# Patient Record
Sex: Female | Born: 1995 | Hispanic: Yes | Marital: Single | Smoking: Former smoker
Health system: Southern US, Community
[De-identification: ages and names within clinical notes are randomized; demographics above are authoritative.]

## PROBLEM LIST (undated history)

## (undated) DIAGNOSIS — Z789 Other specified health status: Secondary | ICD-10-CM

## (undated) HISTORY — PX: NO PAST SURGERIES: SHX2092

---

## 2014-12-07 NOTE — L&D Delivery Note (Signed)
Patient is 19 y.o. G1P0 7546w0d admitted for IOL secondary to pre-eclampsia, hx of morbid obesity and increased risk of Down Syndrome in this pregnancy. Patient was induced with cytotec x2 and foley bulb, followed by pitocin. Patient labored on pitocin until complete and ready to push.    Delivery Note At 4:36 AM a viable and healthy female was delivered via Vaginal, Spontaneous Delivery (Presentation: Direct OA).  APGAR: 9, 9; weight pending.   Placenta status: spontaneous and intact.  Cord: 3 vessels. Cord pH: N/A  Anesthesia: Epidural  Episiotomy: None  Lacerations:  None Suture Repair: N/A Est. Blood Loss (mL):  300 cc  Mom to postpartum.  Baby to Couplet care / Skin to Skin.  [name] 11/27/2015, 4:52 AM   OB fellow attestation: Patient is a G1P0 at 3346w0d who was admitted for IOL for preeclampsia without severe featurs, significant hx of morbid obesity, icnreased Down's risk . She progressed with cytotec, FB and pitocin.  I was gloved and present for delivery in its entirety.  Second stage of labor progressed well. Shallow variable decels during second stage noted.  Complications: none  Lacerations: None  EBL: 300cc  Federico FlakeKimberly Niles Danetra Glock, MD 5:00 AM

## 2015-02-24 ENCOUNTER — Encounter (HOSPITAL_COMMUNITY): Payer: Self-pay | Admitting: Emergency Medicine

## 2015-02-24 ENCOUNTER — Emergency Department (HOSPITAL_COMMUNITY)
Admission: EM | Admit: 2015-02-24 | Discharge: 2015-02-24 | Disposition: A | Discharge status: Home / Self Care | Payer: Self-pay | Attending: Emergency Medicine | Admitting: Emergency Medicine

## 2015-02-24 DIAGNOSIS — Z87891 Personal history of nicotine dependence: Secondary | ICD-10-CM | POA: Insufficient documentation

## 2015-02-24 DIAGNOSIS — R1030 Lower abdominal pain, unspecified: Secondary | ICD-10-CM | POA: Insufficient documentation

## 2015-02-24 DIAGNOSIS — Z79899 Other long term (current) drug therapy: Secondary | ICD-10-CM | POA: Insufficient documentation

## 2015-02-24 DIAGNOSIS — R112 Nausea with vomiting, unspecified: Secondary | ICD-10-CM | POA: Insufficient documentation

## 2015-02-24 DIAGNOSIS — Z3202 Encounter for pregnancy test, result negative: Secondary | ICD-10-CM | POA: Insufficient documentation

## 2015-02-24 DIAGNOSIS — R000 Tachycardia, unspecified: Secondary | ICD-10-CM | POA: Insufficient documentation

## 2015-02-24 DIAGNOSIS — Z791 Long term (current) use of non-steroidal anti-inflammatories (NSAID): Secondary | ICD-10-CM | POA: Insufficient documentation

## 2015-02-24 LAB — URINALYSIS, ROUTINE W REFLEX MICROSCOPIC
Bilirubin Urine: NEGATIVE
GLUCOSE, UA: NEGATIVE mg/dL
Hgb urine dipstick: NEGATIVE
KETONES UR: NEGATIVE mg/dL
LEUKOCYTES UA: NEGATIVE
NITRITE: NEGATIVE
PH: 5.5 (ref 5.0–8.0)
PROTEIN: NEGATIVE mg/dL
SPECIFIC GRAVITY, URINE: 1.024 (ref 1.005–1.030)
UROBILINOGEN UA: 1 mg/dL (ref 0.0–1.0)

## 2015-02-24 LAB — HEPATIC FUNCTION PANEL
ALT: 24 U/L (ref 0–35)
AST: 28 U/L (ref 0–37)
Albumin: 3.7 g/dL (ref 3.5–5.2)
Alkaline Phosphatase: 66 U/L (ref 39–117)
BILIRUBIN TOTAL: 0.6 mg/dL (ref 0.3–1.2)
Bilirubin, Direct: 0.2 mg/dL (ref 0.0–0.5)
Indirect Bilirubin: 0.4 mg/dL (ref 0.3–0.9)
TOTAL PROTEIN: 7 g/dL (ref 6.0–8.3)

## 2015-02-24 LAB — CBC WITH DIFFERENTIAL/PLATELET
BASOS ABS: 0 10*3/uL (ref 0.0–0.1)
Basophils Relative: 0 % (ref 0–1)
Eosinophils Absolute: 0.1 10*3/uL (ref 0.0–0.7)
Eosinophils Relative: 1 % (ref 0–5)
HEMATOCRIT: 39.4 % (ref 36.0–46.0)
HEMOGLOBIN: 13.2 g/dL (ref 12.0–15.0)
LYMPHS ABS: 2.2 10*3/uL (ref 0.7–4.0)
Lymphocytes Relative: 29 % (ref 12–46)
MCH: 26.8 pg (ref 26.0–34.0)
MCHC: 33.5 g/dL (ref 30.0–36.0)
MCV: 80.1 fL (ref 78.0–100.0)
MONO ABS: 0.5 10*3/uL (ref 0.1–1.0)
MONOS PCT: 7 % (ref 3–12)
NEUTROS ABS: 4.7 10*3/uL (ref 1.7–7.7)
NEUTROS PCT: 63 % (ref 43–77)
Platelets: 312 10*3/uL (ref 150–400)
RBC: 4.92 MIL/uL (ref 3.87–5.11)
RDW: 14 % (ref 11.5–15.5)
WBC: 7.5 10*3/uL (ref 4.0–10.5)

## 2015-02-24 LAB — I-STAT CHEM 8, ED
BUN: 14 mg/dL (ref 6–23)
CHLORIDE: 106 mmol/L (ref 96–112)
Calcium, Ion: 1.13 mmol/L (ref 1.12–1.23)
Creatinine, Ser: 0.6 mg/dL (ref 0.50–1.10)
GLUCOSE: 101 mg/dL — AB (ref 70–99)
HEMATOCRIT: 40 % (ref 36.0–46.0)
Hemoglobin: 13.6 g/dL (ref 12.0–15.0)
POTASSIUM: 4 mmol/L (ref 3.5–5.1)
Sodium: 138 mmol/L (ref 135–145)
TCO2: 19 mmol/L (ref 0–100)

## 2015-02-24 LAB — WET PREP, GENITAL
Clue Cells Wet Prep HPF POC: NONE SEEN
Trich, Wet Prep: NONE SEEN
WBC WET PREP: NONE SEEN
Yeast Wet Prep HPF POC: NONE SEEN

## 2015-02-24 LAB — POC URINE PREG, ED: PREG TEST UR: NEGATIVE

## 2015-02-24 LAB — LIPASE, BLOOD: Lipase: 23 U/L (ref 11–59)

## 2015-02-24 MED ORDER — ONDANSETRON HCL 4 MG/2ML IJ SOLN
4.0000 mg | Freq: Once | INTRAMUSCULAR | Status: AC
  Administered 2015-02-24: 4 mg via INTRAVENOUS
  Filled 2015-02-24: qty 2

## 2015-02-24 MED ORDER — MORPHINE SULFATE 4 MG/ML IJ SOLN: 4.0000 mg | Freq: Once | INTRAMUSCULAR | Status: DC

## 2015-02-24 MED ORDER — ONDANSETRON 4 MG PO TBDP: 4.0000 mg | ORAL_TABLET | Freq: Three times a day (TID) | ORAL | Status: DC | PRN

## 2015-02-24 MED ORDER — SODIUM CHLORIDE 0.9 % IV BOLUS (SEPSIS)
1000.0000 mL | Freq: Once | INTRAVENOUS | Status: AC
  Administered 2015-02-24: 1000 mL via INTRAVENOUS

## 2015-02-24 MED ORDER — NAPROXEN 500 MG PO TABS: 500.0000 mg | ORAL_TABLET | Freq: Two times a day (BID) | ORAL | Status: DC

## 2015-02-24 MED ORDER — KETOROLAC TROMETHAMINE 15 MG/ML IJ SOLN
15.0000 mg | Freq: Once | INTRAMUSCULAR | Status: AC
  Administered 2015-02-24: 15 mg via INTRAVENOUS
  Filled 2015-02-24: qty 1

## 2015-02-24 NOTE — ED Provider Notes (Signed)
CSN: [id number]     Arrival date & time 02/24/15  1608 History   First MD Initiated Contact with Patient 02/24/15 1810     Chief Complaint  Patient presents with  . Abdominal Pain     (Consider location/radiation/quality/duration/timing/severity/associated sxs/prior Treatment) Patient is a 19 y.o. female presenting with abdominal pain. The history is provided by the patient.  Abdominal Pain Pain location:  Suprapubic, LLQ and RLQ Pain quality: aching   Pain radiates to:  Back Pain severity:  Moderate Onset quality:  Gradual Duration:  2 weeks Timing:  Constant Progression:  Worsening Chronicity:  New Relieved by:  Nothing Worsened by:  Nothing tried Ineffective treatments:  None tried Associated symptoms: nausea and vomiting   Associated symptoms: no anorexia, no cough, no diarrhea, no dysuria, no fever, no hematuria and no shortness of breath   Risk factors: obesity     History reviewed. No pertinent past medical history. History reviewed. No pertinent past surgical history. No family history on file. History  Substance Use Topics  . Smoking status: Former Games developermoker  . Smokeless tobacco: Not on file  . Alcohol Use: No   OB History    No data available     Review of Systems  Constitutional: Negative for fever.  Respiratory: Negative for cough and shortness of breath.   Gastrointestinal: Positive for nausea, vomiting and abdominal pain. Negative for diarrhea and anorexia.  Genitourinary: Negative for dysuria and hematuria.  All other systems reviewed and are negative.     Allergies  Review of patient's allergies indicates no known allergies.  Home Medications   Prior to Admission medications   Medication Sig Start Date End Date Taking? Authorizing Provider  methylphenidate 36 MG PO CR tablet Take 72 mg by mouth daily.   Yes Historical Provider, MD  TRAZODONE HCL PO Take 1 tablet by mouth at bedtime.   Yes Historical Provider, MD  naproxen (NAPROSYN) 500 MG  tablet Take 1 tablet (500 mg total) by mouth 2 (two) times daily. 02/24/15   Lisa LeitzAlex Santrice Muzio, MD  ondansetron (ZOFRAN ODT) 4 MG disintegrating tablet Take 1 tablet (4 mg total) by mouth every 8 (eight) hours as needed for nausea. 02/24/15   Lisa LeitzAlex Jeovanni Heuring, MD   BP 122/69 mmHg  Pulse 79  Temp(Src) 98.3 F (36.8 C) (Oral)  Resp 18  Ht 5\' 4"  (1.626 m)  Wt 230 lb (104.327 kg)  BMI 39.46 kg/m2  SpO2 100%  LMP 01/09/2015 Physical Exam  Constitutional: She is oriented to person, place, and time. She appears well-developed and well-nourished. No distress.  HENT:  Head: Normocephalic and atraumatic.  Mouth/Throat: No oropharyngeal exudate.  Eyes: Conjunctivae are normal. Pupils are equal, round, and reactive to light.  Neck: Normal range of motion. Neck supple.  Cardiovascular: Regular rhythm, normal heart sounds and intact distal pulses.  Tachycardia present.  Exam reveals no gallop and no friction rub.   No murmur heard. Pulmonary/Chest: Effort normal and breath sounds normal. No respiratory distress. She has no wheezes. She has no rales.  Abdominal: Soft. Bowel sounds are normal. She exhibits no distension and no mass. There is tenderness (suprapubic). There is no rebound and no guarding.  Morbid obesity  Genitourinary: Vagina normal and uterus normal. Pelvic exam was performed with patient supine. There is no rash, tenderness, lesion or injury on the right labia. There is no rash, tenderness, lesion or injury on the left labia. Cervix exhibits no motion tenderness and no discharge. Right adnexum displays no mass, no tenderness  and no fullness. Left adnexum displays no mass, no tenderness and no fullness.  Musculoskeletal: Normal range of motion. She exhibits no edema.  Lymphadenopathy:    She has no cervical adenopathy.  Neurological: She is alert and oriented to person, place, and time.  Skin: Skin is warm and dry. No rash noted. She is not diaphoretic.  Psychiatric: She has a normal mood and  affect. Her behavior is normal. Judgment and thought content normal.  Nursing note and vitals reviewed.   ED Course  Procedures (including critical care time) Labs Review Labs Reviewed  I-STAT CHEM 8, ED - Abnormal; Notable for the following:    Glucose, Bld 101 (*)    All other components within normal limits  WET PREP, GENITAL  URINALYSIS, ROUTINE W REFLEX MICROSCOPIC  CBC WITH DIFFERENTIAL/PLATELET  HEPATIC FUNCTION PANEL  LIPASE, BLOOD  POC URINE PREG, ED  GC/CHLAMYDIA PROBE AMP ([place])    Imaging Review No results found.   EKG Interpretation None      MDM   Final diagnoses:  Lower abdominal pain  Non-intractable vomiting with nausea, vomiting of unspecified type    19 year old morbidly obese female presents with lower abdominal pain, nausea, vomiting of 2 weeks' duration. Denies any vaginal or urinary symptoms. Denies diarrhea. Emesis is nonbloody and nonbilious. On exam she is tender diffusely but primarily suprapubically and right upper quadrant. Negative Murphy's. Afebrile and clinically stable. Abdominal exam is benign and I have low concern for acute abdominal process. We'll defer imaging at this time. Urinalysis and clinic or pregnancy will need to be obtained as well as hepatic function panel. Basic laboratory workup were performed and reassuring. Fluids, Zofran, Toradol given for symptomatic control. Pelvic exam will be performed to evaluate for STI.  9:15 PM pelvic exam reassuring. Abdominal pain significantly improved with anti-inflammatory. Vital signs normalized with fluids. Will treat with anti-inflammatories and antibiotics for likely gastroenteritis. PCP follow-up recommended in the next 2 days. Repeat abdominal exam benign and I have low suspicion for acute abdominal process and no further imaging or workup needed. Will follow up on gonorrhea and chlamydia swabs for for treatment but clinically does not have PID.  Lisa Leitz, MD 02/24/15  2117  Lisa Canal, MD 02/24/15 [phone]

## 2015-02-24 NOTE — ED Notes (Signed)
Pt is aware urine is needed for testing, pt unable to urinate at this time.  

## 2015-02-24 NOTE — Discharge Instructions (Signed)

## 2015-02-24 NOTE — ED Notes (Signed)
Pt c/o lower abd pain x's 2 weeks with nausea and vomiting.  Also c/o lower back pain.  Denies vag. Discharge.

## 2015-02-24 NOTE — ED Notes (Signed)
Pt stable, ambulatory, family at bedside

## 2015-02-25 LAB — GC/CHLAMYDIA PROBE AMP (~~LOC~~) NOT AT ARMC
CHLAMYDIA, DNA PROBE: POSITIVE — AB
NEISSERIA GONORRHEA: NEGATIVE

## 2015-02-27 ENCOUNTER — Telehealth (HOSPITAL_COMMUNITY): Payer: Self-pay

## 2015-02-27 NOTE — ED Notes (Signed)
Positive for chlamydia. Chart sent to edp office for review 

## 2015-02-28 ENCOUNTER — Telehealth (HOSPITAL_BASED_OUTPATIENT_CLINIC_OR_DEPARTMENT_OTHER): Payer: Self-pay | Admitting: *Deleted

## 2015-06-04 ENCOUNTER — Emergency Department (HOSPITAL_COMMUNITY)
Admission: EM | Admit: 2015-06-04 | Discharge: 2015-06-04 | Disposition: A | Discharge status: Home / Self Care | Payer: Medicaid Other | Attending: Emergency Medicine | Admitting: Emergency Medicine

## 2015-06-04 ENCOUNTER — Emergency Department (HOSPITAL_COMMUNITY): Payer: Medicaid Other

## 2015-06-04 ENCOUNTER — Encounter (HOSPITAL_COMMUNITY): Payer: Self-pay | Admitting: *Deleted

## 2015-06-04 DIAGNOSIS — R109 Unspecified abdominal pain: Secondary | ICD-10-CM

## 2015-06-04 DIAGNOSIS — Z3490 Encounter for supervision of normal pregnancy, unspecified, unspecified trimester: Secondary | ICD-10-CM

## 2015-06-04 DIAGNOSIS — R52 Pain, unspecified: Secondary | ICD-10-CM

## 2015-06-04 DIAGNOSIS — Z87891 Personal history of nicotine dependence: Secondary | ICD-10-CM | POA: Diagnosis not present

## 2015-06-04 DIAGNOSIS — Z79899 Other long term (current) drug therapy: Secondary | ICD-10-CM | POA: Diagnosis not present

## 2015-06-04 DIAGNOSIS — Z3A17 17 weeks gestation of pregnancy: Secondary | ICD-10-CM | POA: Diagnosis not present

## 2015-06-04 DIAGNOSIS — Z791 Long term (current) use of non-steroidal anti-inflammatories (NSAID): Secondary | ICD-10-CM | POA: Insufficient documentation

## 2015-06-04 DIAGNOSIS — K8020 Calculus of gallbladder without cholecystitis without obstruction: Secondary | ICD-10-CM | POA: Diagnosis not present

## 2015-06-04 DIAGNOSIS — O99612 Diseases of the digestive system complicating pregnancy, second trimester: Secondary | ICD-10-CM | POA: Insufficient documentation

## 2015-06-04 DIAGNOSIS — O9989 Other specified diseases and conditions complicating pregnancy, childbirth and the puerperium: Secondary | ICD-10-CM | POA: Diagnosis present

## 2015-06-04 DIAGNOSIS — O26899 Other specified pregnancy related conditions, unspecified trimester: Secondary | ICD-10-CM

## 2015-06-04 LAB — COMPREHENSIVE METABOLIC PANEL
ALK PHOS: 42 U/L (ref 38–126)
ALT: 29 U/L (ref 14–54)
AST: 32 U/L (ref 15–41)
Albumin: 3.4 g/dL — ABNORMAL LOW (ref 3.5–5.0)
Anion gap: 5 (ref 5–15)
BUN: <5 mg/dL — ABNORMAL LOW (ref 6–20)
CO2: 22 mmol/L (ref 22–32)
Calcium: 8.9 mg/dL (ref 8.9–10.3)
Chloride: 108 mmol/L (ref 101–111)
Creatinine, Ser: 0.38 mg/dL — ABNORMAL LOW (ref 0.44–1.00)
GLUCOSE: 94 mg/dL (ref 65–99)
POTASSIUM: 4 mmol/L (ref 3.5–5.1)
SODIUM: 135 mmol/L (ref 135–145)
Total Bilirubin: 0.8 mg/dL (ref 0.3–1.2)
Total Protein: 6.2 g/dL — ABNORMAL LOW (ref 6.5–8.1)

## 2015-06-04 LAB — CBC WITH DIFFERENTIAL/PLATELET
Basophils Absolute: 0 10*3/uL (ref 0.0–0.1)
Basophils Relative: 0 % (ref 0–1)
EOS PCT: 1 % (ref 0–5)
Eosinophils Absolute: 0.1 10*3/uL (ref 0.0–0.7)
HCT: 35.9 % — ABNORMAL LOW (ref 36.0–46.0)
Hemoglobin: 12.5 g/dL (ref 12.0–15.0)
Lymphocytes Relative: 30 % (ref 12–46)
Lymphs Abs: 2 10*3/uL (ref 0.7–4.0)
MCH: 27.5 pg (ref 26.0–34.0)
MCHC: 34.8 g/dL (ref 30.0–36.0)
MCV: 78.9 fL (ref 78.0–100.0)
Monocytes Absolute: 0.5 10*3/uL (ref 0.1–1.0)
Monocytes Relative: 8 % (ref 3–12)
NEUTROS PCT: 61 % (ref 43–77)
Neutro Abs: 4.2 10*3/uL (ref 1.7–7.7)
PLATELETS: 252 10*3/uL (ref 150–400)
RBC: 4.55 MIL/uL (ref 3.87–5.11)
RDW: 14.3 % (ref 11.5–15.5)
WBC: 6.8 10*3/uL (ref 4.0–10.5)

## 2015-06-04 LAB — URINE MICROSCOPIC-ADD ON

## 2015-06-04 LAB — ABO/RH: ABO/RH(D): O POS

## 2015-06-04 LAB — WET PREP, GENITAL
Trich, Wet Prep: NONE SEEN
YEAST WET PREP: NONE SEEN

## 2015-06-04 LAB — URINALYSIS, ROUTINE W REFLEX MICROSCOPIC
BILIRUBIN URINE: NEGATIVE
Glucose, UA: NEGATIVE mg/dL
KETONES UR: 15 mg/dL — AB
Leukocytes, UA: NEGATIVE
NITRITE: NEGATIVE
PROTEIN: NEGATIVE mg/dL
Specific Gravity, Urine: 1.023 (ref 1.005–1.030)
UROBILINOGEN UA: 1 mg/dL (ref 0.0–1.0)
pH: 7 (ref 5.0–8.0)

## 2015-06-04 LAB — I-STAT BETA HCG BLOOD, ED (MC, WL, AP ONLY): I-stat hCG, quantitative: >2000 m[IU]/mL — ABNORMAL HIGH (ref <5)

## 2015-06-04 LAB — LIPASE, BLOOD: Lipase: 16 U/L — ABNORMAL LOW (ref 22–51)

## 2015-06-04 MED ORDER — ACETAMINOPHEN 325 MG PO TABS
650.0000 mg | ORAL_TABLET | Freq: Once | ORAL | Status: AC
  Administered 2015-06-04: 650 mg via ORAL
  Filled 2015-06-04: qty 2

## 2015-06-04 NOTE — ED Notes (Signed)
Patient transported to Ultrasound 

## 2015-06-04 NOTE — ED Notes (Addendum)
Pt has first OB appt 06-07-15.  Reports LMP 01-31-15

## 2015-06-04 NOTE — ED Notes (Signed)
Pt in c/o lower abd pain and upper abd pain for the last few weeks, pt is approx [redacted] weeks pregnant, denies vaginal bleeding or discharge

## 2015-06-04 NOTE — ED Notes (Signed)
Pelvic cart at bedside, patient in gown

## 2015-06-04 NOTE — ED Provider Notes (Signed)
CSN: [id number]     Arrival date & time 06/04/15  1237 History  This chart was scribed for non-physician practitioner, Trixie Dredge, PA-C, working with Elwin Mocha, MD by Marica Otter, ED Scribe. This patient was seen in room TR02C/TR02C and the patient's care was started at 2:55 PM.   Chief Complaint  Patient presents with  . Abdominal Pain   The history is provided by the patient. No language interpreter was used.   PCP: No primary care provider on file. HPI Comments: Lisa Erickson is a 19 y.o. female, who is [redacted] weeks pregnant with first OBGYN appointment on 06/07/15, who presents to the Emergency Department complaining of atraumatic, daily, intermittent, 7/10, stabbing epigastric, RUQ and suprapubic abd pain with associated intermittent back pain onset one month ago. Pt reports each episode of abd pain lasts approximately an hour; pt notes multiple episodes of abd pain daily. Pt notes n/v at baseline since the start of her pregnancy and denies  worsening any worsening Sx since the onset of her abd pain. Pt further denies dysuria, fever, chills, vaginal discharge, hematuria, blood in vomit, numbness, tingling, vaginal bleeding, vaginal pain, fecal or urinary incontinence, change in appetite. Pt reports taking daily prenatal vitamins.    History reviewed. No pertinent past medical history. History reviewed. No pertinent past surgical history. History reviewed. No pertinent family history. History  Substance Use Topics  . Smoking status: Former Games developer  . Smokeless tobacco: Not on file  . Alcohol Use: No   OB History    Gravida Para Term Preterm AB TAB SAB Ectopic Multiple Living   1              Review of Systems  Genitourinary: Negative for vaginal pain.  Neurological: Negative for numbness.  All other systems reviewed and are negative.  Allergies  Review of patient's allergies indicates no known allergies.  Home Medications   Prior to Admission medications   Medication Sig  Start Date End Date Taking? Authorizing Provider  methylphenidate 36 MG PO CR tablet Take 72 mg by mouth daily.    Historical Provider, MD  naproxen (NAPROSYN) 500 MG tablet Take 1 tablet (500 mg total) by mouth 2 (two) times daily. 02/24/15   [name], MD  ondansetron (ZOFRAN ODT) 4 MG disintegrating tablet Take 1 tablet (4 mg total) by mouth every 8 (eight) hours as needed for nausea. 02/24/15   [name], MD  TRAZODONE HCL PO Take 1 tablet by mouth at bedtime.    Historical Provider, MD   Triage Vitals: BP 128/76 mmHg  Pulse 104  Temp(Src) 98.9 F (37.2 C) (Oral)  Resp 20  Wt 260 lb (117.935 kg)  SpO2 100%  LMP 01/31/2015 Physical Exam  Constitutional: She appears well-developed and well-nourished. No distress.  HENT:  Head: Normocephalic and atraumatic.  Neck: Neck supple.  Pulmonary/Chest: Effort normal.  Abdominal: Soft. She exhibits no mass. There is tenderness (mild diffuse tenderness ). There is no rebound and no guarding.  Genitourinary:  Exam performed by Gabriela Eves, PA-S under my direct supervision,  exam chaperoned, exam extremely limited secondary to patient habitus.  Date: 06/04/2015 Pelvic exam: normal external genitalia without evidence of trauma. VULVA: normal appearing vulva with no masses, tenderness or lesion. VAGINA: normal appearing vagina with normal color and discharge, no lesions. CERVIX: normal appearing cervix without lesions, cervical motion tenderness absent, cervical os closed with out purulent discharge; vaginal discharge small amt white discharge, Wet prep and DNA probe for chlamydia and GC  obtained.   UTERUS: uterus is normal size, shape, consistency and tender.    Neurological: She is alert.  Skin: She is not diaphoretic.  Nursing note and vitals reviewed.   ED Course  Procedures (including critical care time) DIAGNOSTIC STUDIES: Oxygen Saturation is 100% on RA, nl by my interpretation.    COORDINATION OF CARE: 2:58  PM-Discussed treatment plan which includes pelvic exam and ultrasound with pt at bedside and pt agreed to plan.   Labs Review Labs Reviewed  WET PREP, GENITAL - Abnormal; Notable for the following:    Clue Cells Wet Prep HPF POC MODERATE (*)    WBC, Wet Prep HPF POC FEW (*)    All other components within normal limits  CBC WITH DIFFERENTIAL/PLATELET - Abnormal; Notable for the following:    HCT 35.9 (*)    All other components within normal limits  COMPREHENSIVE METABOLIC PANEL - Abnormal; Notable for the following:    BUN <5 (*)    Creatinine, Ser 0.38 (*)    Total Protein 6.2 (*)    Albumin 3.4 (*)    All other components within normal limits  LIPASE, BLOOD - Abnormal; Notable for the following:    Lipase 16 (*)    All other components within normal limits  URINALYSIS, ROUTINE W REFLEX MICROSCOPIC (NOT AT [facility]) - Abnormal; Notable for the following:    Color, Urine AMBER (*)    APPearance CLOUDY (*)    Hgb urine dipstick TRACE (*)    Ketones, ur 15 (*)    All other components within normal limits  URINE MICROSCOPIC-ADD ON - Abnormal; Notable for the following:    Squamous Epithelial / LPF FEW (*)    Bacteria, UA FEW (*)    All other components within normal limits  I-STAT BETA HCG BLOOD, ED ([facility], [facility], [facility] ONLY) - Abnormal; Notable for the following:    I-stat hCG, quantitative >2000.0 (*)    All other components within normal limits  HIV ANTIBODY (ROUTINE TESTING)  ABO/RH  GC/CHLAMYDIA PROBE AMP ([place]) NOT AT [facility]    Imaging Review US Ob Comp Less 14 Wks  06/04/2015   CLINICAL DATA:  19 year old pregnant female with lower abdominal and pelvic pain.  EXAM: OBSTETRIC <14 WK ULTRASOUND  TECHNIQUE: Transabdominal ultrasound was performed for evaluation of the gestation as well as the maternal uterus and adnexal regions.  COMPARISON:  None  FINDINGS: Intrauterine gestational sac: None  Yolk sac:  Not visualized  Embryo:  Single fetus.  Cardiac Activity: Present.  Heart Rate:  150 bpm  MSD: N/A  CRL:   78  mm   13 w 6 d                  [place] EDC: 12/04/2015  Maternal uterus/adnexae: The right ovary is not visualized. The left ovary measures 4.2 x 2.4 x 3.5 cm and appears unremarkable.  No significant free fluid identified.  IMPRESSION: Single live intrauterine pregnancy with an estimated gestational age of [redacted] weeks, 6 days.   Electronically Signed   By: Elgie Collard M.D.   On: 06/04/2015 16:46   US Abdomen Limited Ruq  06/04/2015   CLINICAL DATA:  One month history of right abdominal pain  EXAM: US ABDOMEN LIMITED - RIGHT UPPER QUADRANT  COMPARISON:  None.  FINDINGS: Gallbladder:  Within the gallbladder, there are small echogenic foci which move and shadow consistent with gallstones. The largest individual gallstone measures approximately 3 mm. There is no gallbladder wall  thickening or pericholecystic fluid. No sonographic Murphy sign noted.  Common bile duct:  Diameter: 4 mm. There is no intrahepatic or extrahepatic biliary duct dilatation.  Liver:  No focal lesion identified.  Liver echogenicity is increased.  IMPRESSION: Cholelithiasis. Gallbladder wall is not thickened, and there is no pericholecystic fluid.  Increased liver echogenicity, most likely indicative of hepatic steatosis. While no focal liver lesions are identified, it must be cautioned that the sensitivity of ultrasound for focal liver lesions is diminished in this circumstance.   Electronically Signed   By: Bretta BangWilliam  Woodruff III M.D.   On: 06/04/2015 16:28     EKG Interpretation None      MDM   Final diagnoses:  Pain  Calculus of gallbladder without cholecystitis without obstruction  Intrauterine pregnancy  Abdominal pain, unspecified abdominal location    Afebrile, nontoxic patient with abdominal pain in pregnancy.  Has been having daily upper and lower abdominal pain x 1 month, lasting hours at a time.  LMP would suggest 17w gestation but pt is not sure of the dates.  Per US, pt is 1548w6d.  Pt will  have OB f/u in 3 days and will follow up on this.  Pt also found to have cholelithiasis, doubt cholecysitis in this pt.  I encouraged prenatal vitamins, tylenol only for pain, OB f/u.   D/C home.  Return precautions discussed.  Discussed result, findings, treatment, and follow up  with patient.  Pt given return precautions.  Pt verbalizes understanding and agrees with plan.       I personally performed the services described in this documentation, which was scribed in my presence. The recorded information has been reviewed and is accurate.    Trixie Dredgemily Dawsyn Ramsaran, PA-C 06/04/15 1729  Elwin MochaBlair Walden, MD 06/05/15 [phone]

## 2015-06-04 NOTE — Discharge Instructions (Signed)
.Read the information below.  You may return to the Emergency Department at any time for worsening condition or any new symptoms that concern you.  If you develop high fevers, worsening abdominal pain, uncontrolled vomiting, or are unable to tolerate fluids by mouth, return to the ER for a recheck.   If you develop severe abdominal pain, vaginal bleeding, abnormal vaginal discharge, [facility] a [facility].    Abdominal Pain During Pregnancy Abdominal pain is common in pregnancy. Most of the time, it does not cause harm. There are many causes of abdominal pain. Some causes are more serious than others. Some of the causes of abdominal pain in pregnancy are easily diagnosed. Occasionally, the diagnosis takes time to understand. Other times, the cause is not determined. Abdominal pain can be a sign that something is very wrong with the pregnancy, or the pain may have nothing to do with the pregnancy at all. For this reason, always tell your health care provider if you have any abdominal discomfort. HOME CARE INSTRUCTIONS  Monitor your abdominal pain for any changes. The following actions may help to alleviate any discomfort you are experiencing:  Do not have sexual intercourse or put anything in your vagina until your symptoms go away completely.  Get plenty of rest until your pain improves.  Drink clear fluids if you feel nauseous. Avoid solid food as long as you are uncomfortable or nauseous.  Only take over-the-counter or prescription medicine as directed by your health care provider.  Keep all follow-up appointments with your health care provider. SEEK IMMEDIATE MEDICAL CARE IF:  You are bleeding, leaking fluid, or passing tissue from the vagina.  You have increasing pain or cramping.  You have persistent vomiting.  You have painful or bloody urination.  You have a fever.  You notice a decrease in your baby's movements.  You have extreme weakness or feel  faint.  You have shortness of breath, with or without abdominal pain.  You develop a severe headache with abdominal pain.  You have abnormal vaginal discharge with abdominal pain.  You have persistent diarrhea.  You have abdominal pain that continues even after rest, or gets worse. MAKE SURE YOU:   Understand these instructions.  Will watch your condition.  Will get help right away if you are not doing well or get worse. Document Released: 11/23/2005 Document Revised: 09/13/2013 Document Reviewed: 06/22/2013 [facility] Patient Information 2015 [place]. This information is not intended to replace advice given to you by your health care provider. Make sure you discuss any questions you have with your health care provider.  Cholelithiasis Cholelithiasis (also called gallstones) is a form of gallbladder disease. The gallbladder is a small organ that helps you digest fats. Symptoms of gallstones are:  Feeling sick to your stomach (nausea).  Throwing up (vomiting).  Belly pain.  Yellowing of the skin (jaundice).  Sudden pain. You may feel the pain for minutes to hours.  Fever.  Pain to the touch. HOME CARE  Only take medicines as told by your doctor.  Eat a low-fat diet until you see your doctor again. Eating fat can result in pain.  Follow up with your doctor as told. Attacks usually happen time after time. Surgery is usually needed for permanent treatment. GET HELP RIGHT AWAY IF:   Your pain gets worse.  Your pain is not helped by medicines.  You have a fever and lasting symptoms for more than 2-3 days.  You have a fever and  your symptoms suddenly get worse.  You keep feeling sick to your stomach and throwing up. MAKE SURE YOU:   Understand these instructions.  Will watch your condition.  Will get help right away if you are not doing well or get worse. Document Released: 05/11/2008 Document Revised: 07/26/2013 Document Reviewed: 05/17/2013 [facility]  Patient Information 2015 [place], [place]. This information is not intended to replace advice given to you by your health care provider. Make sure you discuss any questions you have with your health care provider.

## 2015-06-05 LAB — GC/CHLAMYDIA PROBE AMP (~~LOC~~) NOT AT ARMC
Chlamydia: POSITIVE — AB
NEISSERIA GONORRHEA: NEGATIVE

## 2015-06-05 LAB — HIV ANTIBODY (ROUTINE TESTING W REFLEX): HIV SCREEN 4TH GENERATION: NONREACTIVE

## 2015-06-06 ENCOUNTER — Telehealth (HOSPITAL_BASED_OUTPATIENT_CLINIC_OR_DEPARTMENT_OTHER): Payer: Self-pay | Admitting: Emergency Medicine

## 2015-06-06 NOTE — Telephone Encounter (Signed)
Chart handoff to EDP for treatment plan for + chlamydia 

## 2015-06-08 ENCOUNTER — Telehealth: Payer: Self-pay | Admitting: Emergency Medicine

## 2015-06-08 NOTE — Telephone Encounter (Signed)
Positive Chlamydia  Pt notified 06/08/15: ID verified x three. Patient notified of positive Chlamydia. Patient stated that she was notified by her OB/gyn of positive Chlamydia and treated there.   [facility] faxed

## 2015-06-13 ENCOUNTER — Other Ambulatory Visit (HOSPITAL_COMMUNITY): Payer: Self-pay | Admitting: Nurse Practitioner

## 2015-06-13 DIAGNOSIS — Z3689 Encounter for other specified antenatal screening: Secondary | ICD-10-CM

## 2015-06-13 LAB — OB RESULTS CONSOLE ABO/RH: RH TYPE: POSITIVE

## 2015-06-13 LAB — OB RESULTS CONSOLE HIV ANTIBODY (ROUTINE TESTING): HIV: NONREACTIVE

## 2015-06-13 LAB — OB RESULTS CONSOLE GC/CHLAMYDIA
CHLAMYDIA, DNA PROBE: POSITIVE
GC PROBE AMP, GENITAL: NEGATIVE

## 2015-06-13 LAB — OB RESULTS CONSOLE ANTIBODY SCREEN: ANTIBODY SCREEN: NEGATIVE

## 2015-06-13 LAB — OB RESULTS CONSOLE HEPATITIS B SURFACE ANTIGEN: Hepatitis B Surface Ag: NEGATIVE

## 2015-06-13 LAB — OB RESULTS CONSOLE RPR: RPR: NONREACTIVE

## 2015-06-13 LAB — OB RESULTS CONSOLE RUBELLA ANTIBODY, IGM: Rubella: IMMUNE

## 2015-07-11 ENCOUNTER — Ambulatory Visit (HOSPITAL_COMMUNITY)
Admission: RE | Admit: 2015-07-11 | Discharge: 2015-07-11 | Disposition: A | Discharge status: Home / Self Care | Payer: Medicaid Other | Source: Ambulatory Visit | Attending: Nurse Practitioner | Admitting: Nurse Practitioner

## 2015-07-11 ENCOUNTER — Other Ambulatory Visit (HOSPITAL_COMMUNITY): Payer: Self-pay | Admitting: Nurse Practitioner

## 2015-07-11 DIAGNOSIS — Z3A00 Weeks of gestation of pregnancy not specified: Secondary | ICD-10-CM | POA: Insufficient documentation

## 2015-07-11 DIAGNOSIS — Z3689 Encounter for other specified antenatal screening: Secondary | ICD-10-CM

## 2015-07-11 DIAGNOSIS — Z6841 Body Mass Index (BMI) 40.0 and over, adult: Secondary | ICD-10-CM | POA: Diagnosis not present

## 2015-07-11 DIAGNOSIS — O99212 Obesity complicating pregnancy, second trimester: Secondary | ICD-10-CM | POA: Diagnosis not present

## 2015-07-11 DIAGNOSIS — Z36 Encounter for antenatal screening of mother: Secondary | ICD-10-CM | POA: Insufficient documentation

## 2015-07-11 DIAGNOSIS — E669 Obesity, unspecified: Secondary | ICD-10-CM | POA: Diagnosis not present

## 2015-08-02 ENCOUNTER — Other Ambulatory Visit (HOSPITAL_COMMUNITY): Payer: Self-pay | Admitting: Nurse Practitioner

## 2015-08-02 DIAGNOSIS — O280 Abnormal hematological finding on antenatal screening of mother: Secondary | ICD-10-CM

## 2015-08-02 DIAGNOSIS — O99212 Obesity complicating pregnancy, second trimester: Secondary | ICD-10-CM

## 2015-08-02 DIAGNOSIS — Z3A23 23 weeks gestation of pregnancy: Secondary | ICD-10-CM

## 2015-08-08 ENCOUNTER — Encounter (HOSPITAL_COMMUNITY): Payer: Self-pay

## 2015-08-08 ENCOUNTER — Ambulatory Visit (HOSPITAL_COMMUNITY)
Admission: RE | Admit: 2015-08-08 | Discharge: 2015-08-08 | Disposition: A | Discharge status: Home / Self Care | Payer: Medicaid Other | Source: Ambulatory Visit | Attending: Nurse Practitioner | Admitting: Nurse Practitioner

## 2015-08-08 ENCOUNTER — Other Ambulatory Visit (HOSPITAL_COMMUNITY): Payer: Self-pay | Admitting: Nurse Practitioner

## 2015-08-08 VITALS — BP 135/61 | HR 77 | Wt 273.0 lb

## 2015-08-08 DIAGNOSIS — Z0489 Encounter for examination and observation for other specified reasons: Secondary | ICD-10-CM

## 2015-08-08 DIAGNOSIS — IMO0002 Reserved for concepts with insufficient information to code with codable children: Secondary | ICD-10-CM

## 2015-08-08 DIAGNOSIS — Z3A23 23 weeks gestation of pregnancy: Secondary | ICD-10-CM

## 2015-08-08 DIAGNOSIS — O351XX0 Maternal care for (suspected) chromosomal abnormality in fetus, not applicable or unspecified: Secondary | ICD-10-CM | POA: Insufficient documentation

## 2015-08-08 DIAGNOSIS — Z315 Encounter for genetic counseling: Secondary | ICD-10-CM | POA: Insufficient documentation

## 2015-08-08 DIAGNOSIS — O99212 Obesity complicating pregnancy, second trimester: Secondary | ICD-10-CM

## 2015-08-08 DIAGNOSIS — O280 Abnormal hematological finding on antenatal screening of mother: Secondary | ICD-10-CM

## 2015-08-08 HISTORY — DX: Other specified health status: Z78.9

## 2015-08-08 NOTE — Progress Notes (Signed)
Genetic Counseling  High-Risk Gestation Note  Appointment Date:  08/08/2015 Referred By: Lisa Spindle, NP Date of Birth:  May 30, 1996 Partner:  Lisa Erickson   Pregnancy History: G1P0 Estimated Date of Delivery: 12/04/15 Estimated Gestational Age: [redacted]w[redacted]d Attending: Particia Nearing, MD    [name] and her partner, [name], were seen for genetic counseling because of an increased risk for fetal Down syndrome based on Quad screen through [facility].  In Summary:  Patient has 1 in 27 Down syndrome risk from Quad screen  Detailed ultrasound performed today and within normal limits  Patient declined NIPS and amniocentesis  Follow-up ultrasound scheduled for 09/19/15  They were counseled regarding the Quad screen result and the associated 1 in 99 risk for fetal Down syndrome.  We reviewed chromosomes, nondisjunction, and the common features and variable prognosis of Down syndrome.  In addition, we reviewed the screen adjusted reduction in risks for trisomy 18 (1 in 4,545 to <1 in 10,000) and ONTDs.  We also discussed other explanations for a screen positive result including: a gestational dating error, differences in maternal metabolism, and normal variation. They understand that this screening is not diagnostic for Down syndrome but provides a risk assessment.  We reviewed available screening options including noninvasive prenatal screening (NIPS)/cell free DNA (cfDNA) testing, and detailed ultrasound.  They were counseled that screening tests are used to modify a patient's a priori risk for aneuploidy, typically based on age. This estimate provides a pregnancy specific risk assessment. We reviewed the benefits and limitations of each option. Specifically, we discussed the conditions for which each test screens, the detection rates, and false positive rates of each. They were also counseled regarding diagnostic testing via amniocentesis. We reviewed  the approximate 1 in 300-500 risk for complications for amniocentesis, including spontaneous pregnancy loss.   Detailed ultrasound was performed today. Visualized fetal anatomy was within normal limits. Complete ultrasound report will be sent under separate cover. We reviewed the benefits and limitations of ultrasound as a screening tool for chromosome conditions. After consideration of all the options, they declined NIPS and amniocentesis.  They understand that screening tests cannot rule out all birth defects or genetic syndromes. The patient was advised of this limitation and states she still does not want additional testing at this time.   [name] was provided with written information regarding cystic fibrosis (CF) including the carrier frequency and incidence in the Caucasian, Hispanic, and African American populations, the availability of carrier testing and prenatal diagnosis if indicated.  In addition, we discussed that CF is routinely screened for as part of the [place] newborn screening panel.  She previously had CF carrier screening, which was negative for the mutations screened. Thus, her risk to be a CF carrier has been reduced, but not eliminated.    Both family histories were reviewed and found to be noncontributory for birth defects, intellectual disability, and known genetic conditions. Without further information regarding the provided family history, an accurate genetic risk cannot be calculated. Further genetic counseling is warranted if more information is obtained.  [name] denied exposure to environmental toxins or chemical agents. She denied the use of alcohol, tobacco or street drugs. She denied significant viral illnesses during the course of her pregnancy. Her medical and surgical histories were noncontributory.   I counseled this couple for approximately 40 minutes regarding the above risks and available options.   Lisa Plowman, MS,  Certified  Genetic Counselor 08/08/2015

## 2015-08-19 ENCOUNTER — Other Ambulatory Visit (HOSPITAL_COMMUNITY): Payer: Self-pay | Admitting: Nurse Practitioner

## 2015-09-11 LAB — OB RESULTS CONSOLE GC/CHLAMYDIA
Chlamydia: NEGATIVE
GC PROBE AMP, GENITAL: NEGATIVE

## 2015-09-19 ENCOUNTER — Ambulatory Visit (HOSPITAL_COMMUNITY): Payer: Medicaid Other

## 2015-09-20 ENCOUNTER — Ambulatory Visit (HOSPITAL_COMMUNITY)
Admission: RE | Admit: 2015-09-20 | Discharge: 2015-09-20 | Disposition: A | Discharge status: Home / Self Care | Payer: Medicaid Other | Source: Ambulatory Visit | Attending: Nurse Practitioner | Admitting: Nurse Practitioner

## 2015-09-20 ENCOUNTER — Encounter (HOSPITAL_COMMUNITY): Payer: Self-pay

## 2015-09-20 ENCOUNTER — Other Ambulatory Visit (HOSPITAL_COMMUNITY): Payer: Self-pay | Admitting: Maternal and Fetal Medicine

## 2015-09-20 DIAGNOSIS — O99213 Obesity complicating pregnancy, third trimester: Secondary | ICD-10-CM | POA: Diagnosis present

## 2015-09-20 DIAGNOSIS — E669 Obesity, unspecified: Secondary | ICD-10-CM | POA: Diagnosis not present

## 2015-09-20 DIAGNOSIS — O280 Abnormal hematological finding on antenatal screening of mother: Secondary | ICD-10-CM

## 2015-09-20 DIAGNOSIS — O283 Abnormal ultrasonic finding on antenatal screening of mother: Secondary | ICD-10-CM | POA: Insufficient documentation

## 2015-09-20 DIAGNOSIS — Z3A29 29 weeks gestation of pregnancy: Secondary | ICD-10-CM

## 2015-09-20 DIAGNOSIS — O99212 Obesity complicating pregnancy, second trimester: Secondary | ICD-10-CM

## 2015-11-01 ENCOUNTER — Ambulatory Visit (HOSPITAL_COMMUNITY): Payer: Medicaid Other

## 2015-11-15 LAB — OB RESULTS CONSOLE GBS: STREP GROUP B AG: NEGATIVE

## 2015-11-25 ENCOUNTER — Inpatient Hospital Stay (HOSPITAL_COMMUNITY)
Admission: EM | Admit: 2015-11-25 | Discharge: 2015-11-29 | DRG: 774 | Disposition: A | Discharge status: Home / Self Care | Payer: Medicaid Other | Attending: Obstetrics and Gynecology | Admitting: Obstetrics and Gynecology

## 2015-11-25 ENCOUNTER — Encounter (HOSPITAL_COMMUNITY): Payer: Self-pay | Admitting: *Deleted

## 2015-11-25 ENCOUNTER — Inpatient Hospital Stay (HOSPITAL_COMMUNITY): Payer: Medicaid Other

## 2015-11-25 DIAGNOSIS — Z87891 Personal history of nicotine dependence: Secondary | ICD-10-CM

## 2015-11-25 DIAGNOSIS — O1490 Unspecified pre-eclampsia, unspecified trimester: Secondary | ICD-10-CM | POA: Diagnosis present

## 2015-11-25 DIAGNOSIS — O368130 Decreased fetal movements, third trimester, not applicable or unspecified: Secondary | ICD-10-CM | POA: Diagnosis present

## 2015-11-25 DIAGNOSIS — N390 Urinary tract infection, site not specified: Secondary | ICD-10-CM | POA: Diagnosis present

## 2015-11-25 DIAGNOSIS — O368190 Decreased fetal movements, unspecified trimester, not applicable or unspecified: Secondary | ICD-10-CM

## 2015-11-25 DIAGNOSIS — O99214 Obesity complicating childbirth: Secondary | ICD-10-CM | POA: Diagnosis present

## 2015-11-25 DIAGNOSIS — Z6841 Body Mass Index (BMI) 40.0 and over, adult: Secondary | ICD-10-CM

## 2015-11-25 DIAGNOSIS — Z3A38 38 weeks gestation of pregnancy: Secondary | ICD-10-CM

## 2015-11-25 DIAGNOSIS — O280 Abnormal hematological finding on antenatal screening of mother: Secondary | ICD-10-CM

## 2015-11-25 DIAGNOSIS — O1494 Unspecified pre-eclampsia, complicating childbirth: Principal | ICD-10-CM | POA: Diagnosis present

## 2015-11-25 LAB — URINALYSIS, ROUTINE W REFLEX MICROSCOPIC
BILIRUBIN URINE: NEGATIVE
Glucose, UA: NEGATIVE mg/dL
HGB URINE DIPSTICK: NEGATIVE
Ketones, ur: NEGATIVE mg/dL
Nitrite: POSITIVE — AB
Protein, ur: 30 mg/dL — AB
SPECIFIC GRAVITY, URINE: 1.012 (ref 1.005–1.030)
pH: 6.5 (ref 5.0–8.0)

## 2015-11-25 LAB — COMPREHENSIVE METABOLIC PANEL
ALK PHOS: 129 U/L — AB (ref 38–126)
ALT: 13 U/L — AB (ref 14–54)
AST: 27 U/L (ref 15–41)
Albumin: 2.7 g/dL — ABNORMAL LOW (ref 3.5–5.0)
Anion gap: 10 (ref 5–15)
BILIRUBIN TOTAL: 0.7 mg/dL (ref 0.3–1.2)
BUN: 6 mg/dL (ref 6–20)
CHLORIDE: 106 mmol/L (ref 101–111)
CO2: 22 mmol/L (ref 22–32)
Calcium: 8.7 mg/dL — ABNORMAL LOW (ref 8.9–10.3)
Creatinine, Ser: 0.43 mg/dL — ABNORMAL LOW (ref 0.44–1.00)
GFR calc Af Amer: >60 mL/min (ref >60)
Glucose, Bld: 95 mg/dL (ref 65–99)
Potassium: 3.8 mmol/L (ref 3.5–5.1)
Sodium: 138 mmol/L (ref 135–145)
TOTAL PROTEIN: 5.8 g/dL — AB (ref 6.5–8.1)

## 2015-11-25 LAB — I-STAT CHEM 8, ED
BUN: 6 mg/dL (ref 6–20)
CALCIUM ION: 1.07 mmol/L — AB (ref 1.12–1.23)
Chloride: 105 mmol/L (ref 101–111)
Creatinine, Ser: 0.3 mg/dL — ABNORMAL LOW (ref 0.44–1.00)
Glucose, Bld: 93 mg/dL (ref 65–99)
HCT: 34 % — ABNORMAL LOW (ref 36.0–46.0)
HEMOGLOBIN: 11.6 g/dL — AB (ref 12.0–15.0)
Potassium: 3.7 mmol/L (ref 3.5–5.1)
Sodium: 138 mmol/L (ref 135–145)
TCO2: 24 mmol/L (ref 0–100)

## 2015-11-25 LAB — CBC WITH DIFFERENTIAL/PLATELET
Basophils Absolute: 0 10*3/uL (ref 0.0–0.1)
Basophils Relative: 0 %
Eosinophils Absolute: 0.1 10*3/uL (ref 0.0–0.7)
Eosinophils Relative: 1 %
HEMATOCRIT: 32.9 % — AB (ref 36.0–46.0)
Hemoglobin: 11.3 g/dL — ABNORMAL LOW (ref 12.0–15.0)
LYMPHS ABS: 2.2 10*3/uL (ref 0.7–4.0)
LYMPHS PCT: 23 %
MCH: 28.7 pg (ref 26.0–34.0)
MCHC: 34.3 g/dL (ref 30.0–36.0)
MCV: 83.5 fL (ref 78.0–100.0)
Monocytes Absolute: 0.9 10*3/uL (ref 0.1–1.0)
Monocytes Relative: 9 %
NEUTROS ABS: 6.5 10*3/uL (ref 1.7–7.7)
Neutrophils Relative %: 67 %
Platelets: 207 10*3/uL (ref 150–400)
RBC: 3.94 MIL/uL (ref 3.87–5.11)
RDW: 15.3 % (ref 11.5–15.5)
WBC: 9.6 10*3/uL (ref 4.0–10.5)

## 2015-11-25 LAB — URINE MICROSCOPIC-ADD ON

## 2015-11-25 MED ORDER — LACTATED RINGERS IV BOLUS (SEPSIS)
1000.0000 mL | Freq: Once | INTRAVENOUS | Status: AC
  Administered 2015-11-25: 1000 mL via INTRAVENOUS

## 2015-11-25 NOTE — ED Provider Notes (Signed)
CSN: [id number]     Arrival date & time 11/25/15  2116 History   First MD Initiated Contact with Patient 11/25/15 2118     Chief Complaint  Patient presents with  . Decreased Fetal Movement   19 year old female, G1 P0, at 38 weeks and 5 days presents with decreased fetal movement. Patient endorses that baby typically moves frequently. However last movement was kicks this morning. No complications this pregnancy. She denies any abdominal pain, vaginal discharge, visual changes, headaches, fevers, chills, dysuria, hematuria, flank pain, nausea, vomiting, diarrhea, constipation.  (Consider location/radiation/quality/duration/timing/severity/associated sxs/prior Treatment) Patient is a 19 y.o. female presenting with general illness.  Illness Location:  Decreased fetal movement Severity:  Severe Onset quality:  Sudden Duration:  12 hours Timing:  Constant Progression:  Worsening Chronicity:  New Associated symptoms: no abdominal pain, no chest pain, no diarrhea, no fever, no headaches, no nausea, no shortness of breath and no vomiting     Past Medical History  Diagnosis Date  . Medical history non-contributory    Past Surgical History  Procedure Laterality Date  . No past surgeries     History reviewed. No pertinent family history. Social History  Substance Use Topics  . Smoking status: Former Games developer  . Smokeless tobacco: Never Used  . Alcohol Use: No   OB History    Gravida Para Term Preterm AB TAB SAB Ectopic Multiple Living   1              Review of Systems  Constitutional: Negative for fever and chills.  Respiratory: Negative for shortness of breath.   Cardiovascular: Negative for chest pain, palpitations and leg swelling.  Gastrointestinal: Negative for nausea, vomiting, abdominal pain, diarrhea, constipation and abdominal distention.  Genitourinary: Negative for dysuria, urgency, frequency, hematuria, flank pain, decreased urine volume, vaginal bleeding, vaginal  discharge, vaginal pain and pelvic pain.  Neurological: Negative for dizziness, speech difficulty, light-headedness and headaches.  All other systems reviewed and are negative.     Allergies  Review of patient's allergies indicates no known allergies.  Home Medications   Prior to Admission medications   Medication Sig Start Date End Date Taking? Authorizing Provider  methylphenidate 36 MG PO CR tablet Take 72 mg by mouth daily.    Historical Provider, MD  naproxen (NAPROSYN) 500 MG tablet Take 1 tablet (500 mg total) by mouth 2 (two) times daily. Patient not taking: Reported on 08/08/2015 02/24/15   [name], MD  ondansetron (ZOFRAN ODT) 4 MG disintegrating tablet Take 1 tablet (4 mg total) by mouth every 8 (eight) hours as needed for nausea. Patient not taking: Reported on 08/08/2015 02/24/15   [name], MD  Prenatal Vit-Fe Fumarate-FA (PRENATAL VITAMIN PO) Take by mouth.    Historical Provider, MD  TRAZODONE HCL PO Take 1 tablet by mouth at bedtime.    Historical Provider, MD   BP 159/97 mmHg  Pulse 96  Temp(Src) 98.1 F (36.7 C) (Oral)  Resp 16  SpO2 99%  LMP 01/31/2015 Physical Exam  Constitutional: She is oriented to person, place, and time. She appears well-developed and well-nourished. No distress.  HENT:  Head: Normocephalic and atraumatic.  Cardiovascular: Normal rate, regular rhythm, normal heart sounds and intact distal pulses.  Exam reveals no gallop and no friction rub.   No murmur heard. Pulmonary/Chest: Effort normal and breath sounds normal. No respiratory distress. She has no wheezes. She has no rales. She exhibits no tenderness.  Abdominal: Soft. Bowel sounds are normal. She exhibits no distension and  no mass. There is no tenderness. There is no rebound and no guarding.  Obese abdomen  Lymphadenopathy:    She has no cervical adenopathy.  Neurological: She is alert and oriented to person, place, and time.  Skin: Skin is warm and dry. She is not diaphoretic.   Nursing note and vitals reviewed.   ED Course  .Critical Care Performed by: Rachelle HoraSMITH, Tahliyah Anagnos Authorized by: Rachelle HoraSMITH, Shacarra Choe Total critical care time: 30 minutes Critical care time was exclusive of separately billable procedures and treating other patients. Critical care was necessary to treat or prevent imminent or life-threatening deterioration of the following conditions: fetal distress. Critical care was time spent personally by me on the following activities: blood draw for specimens, re-evaluation of patient's condition, development of treatment plan with patient or surrogate, ordering and performing treatments and interventions, discussions with consultants, examination of patient and ordering and review of radiographic studies. Subsequent provider of critical care: I assumed direction of critical care for this patient from another provider of my specialty.    EMERGENCY DEPARTMENT US PREGNANCY "Study: Limited Ultrasound of the Pelvis"  INDICATIONS:Pregnancy(required) Multiple views of the uterus and pelvic cavity are obtained with a multi-frequency probe.  APPROACH:Transabdominal   PERFORMED BY: Myself  IMAGES ARCHIVED?: Yes  LIMITATIONS: Body habitus  PREGNANCY FREE FLUID: Not assessed  PREGNANCY UTERUS FINDINGS:Uterus enlarged ADNEXAL FINDINGS: Not assessed  PREGNANCY FINDINGS: Difficult to ascertain fetal body given pt's obesity. However, heart motion noted with bradycardia in the 130s.   INTERPRETATION: Viable intrauterine pregnancy   FETAL HEART RATE: 132  COMMENT:  Signs of fetal distress: no movement and low HR!    (including critical care time) Labs Review Labs Reviewed  CBC WITH DIFFERENTIAL/PLATELET - Abnormal; Notable for the following:    Hemoglobin 11.3 (*)    HCT 32.9 (*)    All other components within normal limits  COMPREHENSIVE METABOLIC PANEL - Abnormal; Notable for the following:    Creatinine, Ser 0.43 (*)    Calcium 8.7 (*)    Total Protein  5.8 (*)    Albumin 2.7 (*)    ALT 13 (*)    Alkaline Phosphatase 129 (*)    All other components within normal limits  URINALYSIS, ROUTINE W REFLEX MICROSCOPIC (NOT AT [facility]) - Abnormal; Notable for the following:    APPearance CLOUDY (*)    Protein, ur 30 (*)    Nitrite POSITIVE (*)    Leukocytes, UA TRACE (*)    All other components within normal limits  URINE MICROSCOPIC-ADD ON - Abnormal; Notable for the following:    Squamous Epithelial / LPF 6-30 (*)    Bacteria, UA MANY (*)    All other components within normal limits  I-STAT CHEM 8, ED - Abnormal; Notable for the following:    Creatinine, Ser 0.30 (*)    Calcium, Ion 1.07 (*)    Hemoglobin 11.6 (*)    HCT 34.0 (*)    All other components within normal limits  PROTEIN / CREATININE RATIO, URINE    Imaging Review No results found. I have personally reviewed and evaluated these images and lab results as part of my medical decision-making.   EKG Interpretation None      MDM   Final diagnoses:  Decreased fetal movement, third trimester, not applicable or unspecified fetus   19 year old female 7335w5d pregnant who presents for decreased fetal movement. Bedside ultrasound shows no fetal movement and heart rate in the 130s. Concern for early fetal demise. Stat labs ordered and  transferred to [facility].  Labs not resulted at time of transfer.   Pt was seen under the supervision of Dr. Manus Gunning.     Rachelle Hora, MD 11/25/15 1610  Rachelle Hora, MD 11/25/15 9604  Glynn Octave, MD 11/26/15 [phone]

## 2015-11-25 NOTE — ED Notes (Signed)
Called OB rapid response 

## 2015-11-25 NOTE — ED Notes (Signed)
Pt. Is 38 weeks and 5 days pregnant and states she hasnt felt the baby move since 8am this morning. G1P0. [facility] Pt. Denies complications during pregnancy. Pt states baby usually kicks at least 10 times an hour

## 2015-11-26 ENCOUNTER — Inpatient Hospital Stay (HOSPITAL_COMMUNITY): Payer: Medicaid Other | Admitting: Anesthesiology

## 2015-11-26 ENCOUNTER — Encounter (HOSPITAL_COMMUNITY): Payer: Self-pay | Admitting: *Deleted

## 2015-11-26 DIAGNOSIS — N390 Urinary tract infection, site not specified: Secondary | ICD-10-CM | POA: Diagnosis present

## 2015-11-26 DIAGNOSIS — O99214 Obesity complicating childbirth: Secondary | ICD-10-CM | POA: Diagnosis present

## 2015-11-26 DIAGNOSIS — O1494 Unspecified pre-eclampsia, complicating childbirth: Secondary | ICD-10-CM | POA: Diagnosis present

## 2015-11-26 DIAGNOSIS — Z6841 Body Mass Index (BMI) 40.0 and over, adult: Secondary | ICD-10-CM | POA: Diagnosis not present

## 2015-11-26 DIAGNOSIS — Z3A38 38 weeks gestation of pregnancy: Secondary | ICD-10-CM | POA: Diagnosis not present

## 2015-11-26 DIAGNOSIS — Z87891 Personal history of nicotine dependence: Secondary | ICD-10-CM | POA: Diagnosis not present

## 2015-11-26 DIAGNOSIS — O368130 Decreased fetal movements, third trimester, not applicable or unspecified: Secondary | ICD-10-CM | POA: Diagnosis present

## 2015-11-26 DIAGNOSIS — O1490 Unspecified pre-eclampsia, unspecified trimester: Secondary | ICD-10-CM | POA: Diagnosis present

## 2015-11-26 LAB — ABO/RH: ABO/RH(D): O POS

## 2015-11-26 LAB — CBC
HEMATOCRIT: 30.4 % — AB (ref 36.0–46.0)
HEMATOCRIT: 32.1 % — AB (ref 36.0–46.0)
HEMOGLOBIN: 10.3 g/dL — AB (ref 12.0–15.0)
Hemoglobin: 10.8 g/dL — ABNORMAL LOW (ref 12.0–15.0)
MCH: 27.8 pg (ref 26.0–34.0)
MCH: 28 pg (ref 26.0–34.0)
MCHC: 33.9 g/dL (ref 30.0–36.0)
MCHC: 33.6 g/dL (ref 30.0–36.0)
MCV: 82.2 fL (ref 78.0–100.0)
MCV: 83.2 fL (ref 78.0–100.0)
PLATELETS: 195 10*3/uL (ref 150–400)
PLATELETS: 181 10*3/uL (ref 150–400)
RBC: 3.7 MIL/uL — AB (ref 3.87–5.11)
RBC: 3.86 MIL/uL — ABNORMAL LOW (ref 3.87–5.11)
RDW: 15.5 % (ref 11.5–15.5)
RDW: 15.6 % — AB (ref 11.5–15.5)
WBC: 8.6 10*3/uL (ref 4.0–10.5)
WBC: 10.2 10*3/uL (ref 4.0–10.5)

## 2015-11-26 LAB — PROTEIN / CREATININE RATIO, URINE
CREATININE, URINE: 62 mg/dL
PROTEIN CREATININE RATIO: 0.94 mg/mg{creat} — AB (ref 0.00–0.15)
TOTAL PROTEIN, URINE: 58 mg/dL

## 2015-11-26 LAB — RPR: RPR Ser Ql: NONREACTIVE

## 2015-11-26 LAB — TYPE AND SCREEN
ABO/RH(D): O POS
ANTIBODY SCREEN: NEGATIVE

## 2015-11-26 LAB — GC/CHLAMYDIA PROBE AMP (~~LOC~~) NOT AT ARMC
Chlamydia: NEGATIVE
Neisseria Gonorrhea: NEGATIVE

## 2015-11-26 LAB — HIV ANTIBODY (ROUTINE TESTING W REFLEX): HIV Screen 4th Generation wRfx: NONREACTIVE

## 2015-11-26 MED ORDER — LIDOCAINE HCL (PF) 1 % IJ SOLN
INTRAMUSCULAR | Status: DC | PRN
  Administered 2015-11-26 (×2): 4 mL via EPIDURAL

## 2015-11-26 MED ORDER — DEXTROSE 5 % IV SOLN
1.0000 g | INTRAVENOUS | Status: DC
  Administered 2015-11-26 – 2015-11-27 (×2): 1 g via INTRAVENOUS
  Filled 2015-11-26 (×2): qty 10

## 2015-11-26 MED ORDER — TERBUTALINE SULFATE 1 MG/ML IJ SOLN: 0.2500 mg | Freq: Once | INTRAMUSCULAR | Status: DC | PRN

## 2015-11-26 MED ORDER — EPHEDRINE 5 MG/ML INJ: 10.0000 mg | INTRAVENOUS | Status: DC | PRN

## 2015-11-26 MED ORDER — FENTANYL 2.5 MCG/ML BUPIVACAINE 1/10 % EPIDURAL INFUSION (WH - ANES)
14.0000 mL/h | INTRAMUSCULAR | Status: DC | PRN
Start: 2015-11-26 — End: 2015-11-27
  Administered 2015-11-26 (×2): 11.5 mL/h via EPIDURAL
  Administered 2015-11-27: 14 mL/h via EPIDURAL
  Administered 2015-11-27: 11.5 mL/h via EPIDURAL
  Filled 2015-11-26 (×2): qty 125

## 2015-11-26 MED ORDER — ONDANSETRON HCL 4 MG/2ML IJ SOLN: 4.0000 mg | Freq: Four times a day (QID) | INTRAMUSCULAR | Status: DC | PRN

## 2015-11-26 MED ORDER — LIDOCAINE HCL (PF) 1 % IJ SOLN
30.0000 mL | INTRAMUSCULAR | Status: DC | PRN
Start: 2015-11-26 — End: 2015-11-27
  Filled 2015-11-26: qty 30

## 2015-11-26 MED ORDER — MISOPROSTOL 25 MCG QUARTER TABLET
25.0000 ug | ORAL_TABLET | ORAL | Status: DC | PRN
  Administered 2015-11-26 (×2): 25 ug via VAGINAL
  Filled 2015-11-26 (×2): qty 0.25

## 2015-11-26 MED ORDER — OXYTOCIN BOLUS FROM INFUSION: 500.0000 mL | INTRAVENOUS | Status: DC

## 2015-11-26 MED ORDER — FOSFOMYCIN TROMETHAMINE 3 G PO PACK: 3.0000 g | PACK | Freq: Once | ORAL | Status: DC

## 2015-11-26 MED ORDER — DIPHENHYDRAMINE HCL 50 MG/ML IJ SOLN: 12.5000 mg | INTRAMUSCULAR | Status: DC | PRN

## 2015-11-26 MED ORDER — OXYTOCIN 40 UNITS IN LACTATED RINGERS INFUSION - SIMPLE MED
62.5000 mL/h | INTRAVENOUS | Status: DC
  Administered 2015-11-27: 62.5 mL/h via INTRAVENOUS
  Filled 2015-11-26: qty 1000

## 2015-11-26 MED ORDER — LACTATED RINGERS IV SOLN
INTRAVENOUS | Status: DC
  Administered 2015-11-26 – 2015-11-27 (×5): via INTRAVENOUS

## 2015-11-26 MED ORDER — OXYTOCIN 40 UNITS IN LACTATED RINGERS INFUSION - SIMPLE MED
1.0000 m[IU]/min | INTRAVENOUS | Status: DC
  Administered 2015-11-26: 2 m[IU]/min via INTRAVENOUS

## 2015-11-26 MED ORDER — CITRIC ACID-SODIUM CITRATE 334-500 MG/5ML PO SOLN: 30.0000 mL | ORAL | Status: DC | PRN

## 2015-11-26 MED ORDER — ACETAMINOPHEN 325 MG PO TABS: 650.0000 mg | ORAL_TABLET | ORAL | Status: DC | PRN

## 2015-11-26 MED ORDER — PHENYLEPHRINE 40 MCG/ML (10ML) SYRINGE FOR IV PUSH (FOR BLOOD PRESSURE SUPPORT)
80.0000 ug | PREFILLED_SYRINGE | INTRAVENOUS | Status: DC | PRN
  Filled 2015-11-26: qty 20

## 2015-11-26 MED ORDER — LACTATED RINGERS IV SOLN
500.0000 mL | INTRAVENOUS | Status: DC | PRN
  Administered 2015-11-26: 500 mL via INTRAVENOUS

## 2015-11-26 NOTE — Progress Notes (Signed)
Lisa Erickson is a 19 y.o. G1P0 at 7437w6d by ultrasound admitted for induction of labor due to preeclampsia no severe features.  Subjective: Feeling good after epidural. Had SROM  Objective: BP 135/71 mmHg  Pulse 83  Temp(Src) 99.1 F (37.3 C) (Oral)  Resp 18  Ht 5\' 2"  (1.575 m)  Wt 285 lb (129.275 kg)  BMI 52.11 kg/m2  SpO2 100%  LMP 01/31/2015     FHT: 150/mod/+accels, no decels  UC:   Regular every 2-265min SVE:   Dilation: 7 Effacement (%): 70, 80 Station: -2 Exam by:: Dr. Alvester MorinNewton  Placed FSE and IUPC  Labs: Lab Results  Component Value Date   WBC 10.2 11/26/2015   HGB 10.8* 11/26/2015   HCT 32.1* 11/26/2015   MCV 83.2 11/26/2015   PLT 181 11/26/2015    Assessment / Plan: Induction of labor due to preeclampsia,  progressing well on pitocin  Labor: Progressing normally. Internalized monitors for improved MVU detection.  Preeclampsia:  without severe features Fetal Wellbeing:  Category I, FSE to help with fetal heart tracing.  Pain Control:  Epidural I/D:  n/a Anticipated MOD:  NSVD  Lisa Erickson 11/26/2015, 11:59 PM

## 2015-11-26 NOTE — Anesthesia Preprocedure Evaluation (Signed)
Anesthesia Evaluation  Patient identified by MRN, date of birth, ID band Patient awake    Reviewed: Allergy & Precautions, Patient's Chart, lab work & pertinent test results  Airway Mallampati: III  TM Distance: >3 FB Neck ROM: Full    Dental no notable dental hx. (+) Teeth Intact   Pulmonary former smoker,    Pulmonary exam normal breath sounds clear to auscultation       Cardiovascular hypertension, Normal cardiovascular exam Rhythm:Regular Rate:Normal     Neuro/Psych negative neurological ROS  negative psych ROS   GI/Hepatic negative GI ROS, Neg liver ROS,   Endo/Other  Morbid obesity  Renal/GU negative Renal ROS  negative genitourinary   Musculoskeletal negative musculoskeletal ROS (+)   Abdominal (+) + obese,   Peds  Hematology  (+) anemia ,   Anesthesia Other Findings   Reproductive/Obstetrics (+) Pregnancy 38 weeks 6 days                             Anesthesia Physical Anesthesia Plan  ASA: III  Anesthesia Plan: Epidural   Post-op Pain Management:    Induction:   Airway Management Planned: Natural Airway  Additional Equipment:   Intra-op Plan:   Post-operative Plan:   Informed Consent: I have reviewed the patients History and Physical, chart, labs and discussed the procedure including the risks, benefits and alternatives for the proposed anesthesia with the patient or authorized representative who has indicated his/her understanding and acceptance.     Plan Discussed with: Anesthesiologist  Anesthesia Plan Comments:         Anesthesia Quick Evaluation

## 2015-11-26 NOTE — Anesthesia Procedure Notes (Signed)
Epidural Patient location during procedure: OB Start time: 11/26/2015 8:28 PM  Staffing Anesthesiologist: Mal AmabileFOSTER, Zacchaeus Halm  Preanesthetic Checklist Completed: patient identified, site marked, surgical consent, pre-op evaluation, timeout performed, IV checked, risks and benefits discussed and monitors and equipment checked  Epidural Patient position: sitting Prep: site prepped and draped and DuraPrep Patient monitoring: continuous pulse ox and blood pressure Approach: midline Location: L4-L5 Injection technique: LOR air  Needle:  Needle type: Tuohy  Needle gauge: 17 G Needle length: 9 cm and 9 Needle insertion depth: 9 cm Catheter type: closed end flexible Catheter size: 19 Gauge Catheter at skin depth: 15 cm Test dose: negative and Other  Assessment Events: blood not aspirated, injection not painful, no injection resistance, negative IV test and no paresthesia  Additional Notes Patient identified. Risks and benefits discussed including failed block, incomplete  Pain control, post dural puncture headache, nerve damage, paralysis, blood pressure Changes, nausea, vomiting, reactions to medications-both toxic and allergic and post Partum back pain. All questions were answered. Patient expressed understanding and wished to proceed. Sterile technique was used throughout procedure. Epidural site was Dressed with sterile barrier dressing. No paresthesias, signs of intravascular injection Or signs of intrathecal spread were encountered.  Patient was more comfortable after the epidural was dosed. Please see RN's note for documentation of vital signs and FHR which are stable.

## 2015-11-26 NOTE — MAU Note (Signed)
Pt present to [facility] w/ no FM today

## 2015-11-26 NOTE — H&P (Signed)
LABOR AND DELIVERY ADMISSION HISTORY AND PHYSICAL NOTE  Lisa Erickson is a 19 y.o. female G1P0 with IUP at [redacted]w[redacted]d by 13 wk u/s presenting for decreased fetal movement since earlier this evening. No headache, vision change, sob, or ruq pain. No leakage of fluid, bleeding, or contractions. No dysuria or hematuria or frequency/urgency. No fever or nausea/vomiting.    Prenatal History/Complications:  Past Medical History: Past Medical History  Diagnosis Date  . Medical history non-contributory     Past Surgical History: Past Surgical History  Procedure Laterality Date  . No past surgeries      Obstetrical History: OB History    Gravida Para Term Preterm AB TAB SAB Ectopic Multiple Living   1               Social History: Social History   Social History  . Marital Status: Single    Spouse Name: N/A  . Number of Children: N/A  . Years of Education: N/A   Social History Main Topics  . Smoking status: Former Games developer  . Smokeless tobacco: Never Used  . Alcohol Use: No  . Drug Use: No  . Sexual Activity: Not Asked   Other Topics Concern  . None   Social History Narrative    Family History: History reviewed. No pertinent family history.  Allergies: No Known Allergies  Prescriptions prior to admission  Medication Sig Dispense Refill Last Dose  . methylphenidate 36 MG PO CR tablet Take 72 mg by mouth daily.   11/26/2015  . naproxen (NAPROSYN) 500 MG tablet Take 1 tablet (500 mg total) by mouth 2 (two) times daily. 10 tablet 0 11/26/2015  . ondansetron (ZOFRAN ODT) 4 MG disintegrating tablet Take 1 tablet (4 mg total) by mouth every 8 (eight) hours as needed for nausea. 10 tablet 0 11/26/2015  . Prenatal Vit-Fe Fumarate-FA (PRENATAL VITAMIN PO) Take by mouth.   11/26/2015  . TRAZODONE HCL PO Take 1 tablet by mouth at bedtime.   11/26/2015     Review of Systems   All systems reviewed and negative except as stated in HPI  Blood pressure 134/84, pulse 99,  temperature 98.1 F (36.7 C), temperature source Oral, resp. rate 16, last menstrual period 01/31/2015, SpO2 99 %. General appearance: alert, cooperative and appears stated age Lungs: clear to auscultation bilaterally Heart: regular rate and rhythm Abdomen: soft, non-tender; bowel sounds normal. No cva tenderness. Extremities: No calf swelling or tenderness Presentation: cephalic Fetal monitoring: 140/mod/+a/-d Uterine activity: quiet  Dilation: Closed Effacement (%): Thick Station: -3 Exam by:: Wouk   Prenatal labs: ABO, Rh: O/Positive/-- (07/07 0000) Antibody: Negative (07/07 0000) Rubella: !Error!immune RPR: Nonreactive (07/07 0000)  HBsAg: Negative (07/07 0000)  HIV: Non-reactive (07/07 0000)  GBS:   neg 1 hr Glucola: 131 Genetic screening:  Quad with 1:99 risk down's Anatomy US: wnl  Prenatal Transfer Tool  Maternal Diabetes: No Genetic Screening: Abnormal:  Results: Elevated risk of Trisomy 21 Maternal Ultrasounds/Referrals: Normal Fetal Ultrasounds or other Referrals:  None Maternal Substance Abuse:  No Significant Maternal Medications:  None Significant Maternal Lab Results: Lab values include: Group B Strep negative  Results for orders placed or performed during the hospital encounter of 11/25/15 (from the past 24 hour(s))  CBC with Differential   Collection Time: 11/25/15 10:10 PM  Result Value Ref Range   WBC 9.6 4.0 - 10.5 K/uL   RBC 3.94 3.87 - 5.11 MIL/uL   Hemoglobin 11.3 (L) 12.0 - 15.0 g/dL   HCT 16.1 (L) 09.6 -  46.0 %   MCV 83.5 78.0 - 100.0 fL   MCH 28.7 26.0 - 34.0 pg   MCHC 34.3 30.0 - 36.0 g/dL   RDW 16.115.3 09.611.5 - 04.515.5 %   Platelets 207 150 - 400 K/uL   Neutrophils Relative % 67 %   Neutro Abs 6.5 1.7 - 7.7 K/uL   Lymphocytes Relative 23 %   Lymphs Abs 2.2 0.7 - 4.0 K/uL   Monocytes Relative 9 %   Monocytes Absolute 0.9 0.1 - 1.0 K/uL   Eosinophils Relative 1 %   Eosinophils Absolute 0.1 0.0 - 0.7 K/uL   Basophils Relative 0 %   Basophils  Absolute 0.0 0.0 - 0.1 K/uL  Comprehensive metabolic panel   Collection Time: 11/25/15 10:10 PM  Result Value Ref Range   Sodium 138 135 - 145 mmol/L   Potassium 3.8 3.5 - 5.1 mmol/L   Chloride 106 101 - 111 mmol/L   CO2 22 22 - 32 mmol/L   Glucose, Bld 95 65 - 99 mg/dL   BUN 6 6 - 20 mg/dL   Creatinine, Ser 4.090.43 (L) 0.44 - 1.00 mg/dL   Calcium 8.7 (L) 8.9 - 10.3 mg/dL   Total Protein 5.8 (L) 6.5 - 8.1 g/dL   Albumin 2.7 (L) 3.5 - 5.0 g/dL   AST 27 15 - 41 U/L   ALT 13 (L) 14 - 54 U/L   Alkaline Phosphatase 129 (H) 38 - 126 U/L   Total Bilirubin 0.7 0.3 - 1.2 mg/dL   GFR calc non Af Amer >60 >60 mL/min   GFR calc Af Amer >60 >60 mL/min   Anion gap 10 5 - 15  I-Stat Chem 8, ED   Collection Time: 11/25/15 10:20 PM  Result Value Ref Range   Sodium 138 135 - 145 mmol/L   Potassium 3.7 3.5 - 5.1 mmol/L   Chloride 105 101 - 111 mmol/L   BUN 6 6 - 20 mg/dL   Creatinine, Ser 8.110.30 (L) 0.44 - 1.00 mg/dL   Glucose, Bld 93 65 - 99 mg/dL   Calcium, Ion 9.141.07 (L) 1.12 - 1.23 mmol/L   TCO2 24 0 - 100 mmol/L   Hemoglobin 11.6 (L) 12.0 - 15.0 g/dL   HCT 78.234.0 (L) 95.636.0 - 21.346.0 %  Urinalysis, Routine w reflex microscopic (not [facility])   Collection Time: 11/25/15 10:36 PM  Result Value Ref Range   Color, Urine YELLOW YELLOW   APPearance CLOUDY (A) CLEAR   Specific Gravity, Urine 1.012 1.005 - 1.030   pH 6.5 5.0 - 8.0   Glucose, UA NEGATIVE NEGATIVE mg/dL   Hgb urine dipstick NEGATIVE NEGATIVE   Bilirubin Urine NEGATIVE NEGATIVE   Ketones, ur NEGATIVE NEGATIVE mg/dL   Protein, ur 30 (A) NEGATIVE mg/dL   Nitrite POSITIVE (A) NEGATIVE   Leukocytes, UA TRACE (A) NEGATIVE  Urine microscopic-add on   Collection Time: 11/25/15 10:36 PM  Result Value Ref Range   Squamous Epithelial / LPF 6-30 (A) NONE SEEN   WBC, UA 6-30 0 - 5 WBC/hpf   RBC / HPF 0-5 0 - 5 RBC/hpf   Bacteria, UA MANY (A) NONE SEEN   Urine-Other MUCOUS PRESENT   Protein / creatinine ratio, urine   Collection Time: 11/26/15  12:15 AM  Result Value Ref Range   Creatinine, Urine 62.00 mg/dL   Total Protein, Urine 58 mg/dL   Protein Creatinine Ratio 0.94 (H) 0.00 - 0.15 mg/mg[Cre]    Patient Active Problem List   Diagnosis Date Noted  .  Abnormal maternal serum screening test 08/08/2015    Assessment: Lisa Erickson is a 19 y.o. G1P0 at [redacted]w[redacted]d here for decreased fetal movement. NST reactive, BPP 8/8. However, BPs elevated, and UPC 0.94 - patient has preeclampsia. No severe features. Patient says BP has been elevated at recent prenatal visits and was told it was from "walking."   #Labor: admit for IOL #Pain: Eventual epidural #FWB: Cat 1 #ID:  gbs neg #PreE: no indication for Mg at this time. No indication for antihypertensives at this time #UTI: asymptomatic. No signs/symptoms pyelonephritis. Will give ceftriaxone while in labor and then transition to orals. F/u culture  Silvano Bilis 11/26/2015, 2:13 AM

## 2015-11-26 NOTE — Progress Notes (Signed)
Lisa ProudBrittany Burkhalter is a 19 y.o. G1P0 at 1146w6d by ultrasound admitted for induction of labor due to preeclampsia no severe features.  Subjective: Wants pain relief  Objective: BP 138/70 mmHg  Pulse 86  Temp(Src) 98.3 F (36.8 C) (Oral)  Resp 18  Ht 5\' 2"  (1.575 m)  Wt 129.275 kg (285 lb)  BMI 52.11 kg/m2  SpO2 99%  LMP 01/31/2015      FHT:   Fetal Heart Rate A      Mode  External filed at 11/26/2015 1818    Baseline Rate (A)  135 bpm filed at 11/26/2015 1818    Variability  6-25 BPM filed at 11/26/2015 1818    Accelerations  10 x 10 filed at 11/26/2015 1818    Decelerations  None filed at 11/26/2015 1818     UC:   irregular, every 2-4 minutes SVE:   Dilation: 7 Effacement (%): 80 Station: -2 Exam by:: Eilam Shrewsbury  Labs: Lab Results  Component Value Date   WBC 8.6 11/26/2015   HGB 10.3* 11/26/2015   HCT 30.4* 11/26/2015   MCV 82.2 11/26/2015   PLT 195 11/26/2015    Assessment / Plan: Induction of labor due to preeclampsia,  progressing well on pitocin  Labor: Progressing normally Preeclampsia:  without severe features Fetal Wellbeing:  Category I Pain Control:  Epidural I/D:  n/a Anticipated MOD:  NSVD  Rondy Krupinski 11/26/2015, 6:23 PM

## 2015-11-26 NOTE — Progress Notes (Signed)
Labor Progress Note Pershing ProudBrittany Hafner is a 19 y.o. G1P0 at 7050w6d presented for decreased fetal movement. Found to have preeclampsia without severe features  S: Patient feeling contractions.   O:  BP 137/75 mmHg  Pulse 77  Temp(Src) 97.7 F (36.5 C) (Oral)  Resp 20  Ht 5\' 2"  (1.575 m)  Wt 285 lb (129.275 kg)  BMI 52.11 kg/m2  SpO2 99%  LMP 01/31/2015 EFM: 130 bpm/moderate/accels present   CVE: Dilation: 1 Effacement (%): 50 Cervical Position: Posterior, Middle Station: -3 Exam by:: Dr. Alvester MorinNewton   A&P: 19 y.o. G1P0 8550w6d d presented for decreased fetal movement. Found to have preeclampsia without severe features   #Labor: Foley bulb place @ 10:25 #Pain: Epidural when needed  #FWB:Category 1 #GBS negative   Lilibeth Opie Angelene GiovanniZ Charm Stenner, MD 10:31 AM

## 2015-11-26 NOTE — Progress Notes (Signed)
Called to bedside for inability to monitor FHR low on abdomen.  Performed bedside US which showed infant was cephalic. Notable dense adipose tissue that makes US difficult but reliably saw HR in the RLQ and ribs cage in midline/suprapubic.   Plan for cervical exam check in ~30 minutes and start pitocin if improved effacement. Discussed with RN.   Federico FlakeKimberly Niles Kasen Sako, MD

## 2015-11-26 NOTE — Progress Notes (Addendum)
Labor Progress Note Pershing ProudBrittany Lorton is a 19 y.o. G1P0 at 725w6d presented for decreased fetal movement. Found to have preeclampsia without severe features   S: s/p FB.   O:  BP 151/84 mmHg  Pulse 78  Temp(Src) 97.9 F (36.6 C) (Oral)  Resp 18  Ht 5\' 2"  (1.575 m)  Wt 285 lb (129.275 kg)  BMI 52.11 kg/m2  SpO2 99%  LMP 01/31/2015 EFM: 130 bpm/moderate/accels present   CVE: Dilation: 4 Effacement (%): 50 Cervical Position: Posterior, Middle Station: -3 Exam by:: Dicky DoeFelicia Morris, RNC   A&P: 19 y.o. G1P0 105w6d d presented for decreased fetal movement. Found to have preeclampsia without severe features   #Labor: Foley bulb place @ 10:25-1324. Plan for cytotec given thickeness of cervix.  Placed 1:24 PM.  #Pain: Epidural when needed  #FWB:Category 1 #GBS negative   #Diet: Patient is currently hungry. Discussed risk/benefits of Clear liquid diet. Given patient is still in cervical ripening phase and low risk for seizure or emergent delivery I feel comfortable with patient eating.  We discussed risks of aspiration with seizure or emergent delivery that would require intubation. Patient understands these risks and would to eat. She will have a regular diet and then change to clears after eating lunch.   Federico FlakeKimberly Niles Reanna Scoggin, MD 1:24 PM

## 2015-11-27 ENCOUNTER — Encounter (HOSPITAL_COMMUNITY): Payer: Self-pay | Admitting: *Deleted

## 2015-11-27 DIAGNOSIS — N390 Urinary tract infection, site not specified: Secondary | ICD-10-CM

## 2015-11-27 DIAGNOSIS — O99214 Obesity complicating childbirth: Secondary | ICD-10-CM

## 2015-11-27 DIAGNOSIS — Z3A38 38 weeks gestation of pregnancy: Secondary | ICD-10-CM

## 2015-11-27 DIAGNOSIS — Z87891 Personal history of nicotine dependence: Secondary | ICD-10-CM

## 2015-11-27 DIAGNOSIS — O368130 Decreased fetal movements, third trimester, not applicable or unspecified: Secondary | ICD-10-CM

## 2015-11-27 DIAGNOSIS — O1494 Unspecified pre-eclampsia, complicating childbirth: Secondary | ICD-10-CM

## 2015-11-27 LAB — CBC
HCT: 32.1 % — ABNORMAL LOW (ref 36.0–46.0)
Hemoglobin: 10.7 g/dL — ABNORMAL LOW (ref 12.0–15.0)
MCH: 27.5 pg (ref 26.0–34.0)
MCHC: 33.3 g/dL (ref 30.0–36.0)
MCV: 82.5 fL (ref 78.0–100.0)
PLATELETS: 193 10*3/uL (ref 150–400)
RBC: 3.89 MIL/uL (ref 3.87–5.11)
RDW: 15.4 % (ref 11.5–15.5)
WBC: 13.8 10*3/uL — AB (ref 4.0–10.5)

## 2015-11-27 LAB — CULTURE, OB URINE

## 2015-11-27 MED ORDER — OXYCODONE-ACETAMINOPHEN 5-325 MG PO TABS: 2.0000 | ORAL_TABLET | ORAL | Status: DC | PRN

## 2015-11-27 MED ORDER — ACETAMINOPHEN 325 MG PO TABS
650.0000 mg | ORAL_TABLET | ORAL | Status: DC | PRN
  Administered 2015-11-27: 650 mg via ORAL
  Filled 2015-11-27: qty 2

## 2015-11-27 MED ORDER — ONDANSETRON HCL 4 MG/2ML IJ SOLN: 4.0000 mg | INTRAMUSCULAR | Status: DC | PRN

## 2015-11-27 MED ORDER — DIPHENHYDRAMINE HCL 25 MG PO CAPS: 25.0000 mg | ORAL_CAPSULE | Freq: Four times a day (QID) | ORAL | Status: DC | PRN

## 2015-11-27 MED ORDER — CEFPODOXIME PROXETIL 200 MG PO TABS
100.0000 mg | ORAL_TABLET | Freq: Two times a day (BID) | ORAL | Status: AC
  Administered 2015-11-28 (×2): 100 mg via ORAL
  Filled 2015-11-27 (×3): qty 1

## 2015-11-27 MED ORDER — CEFPODOXIME PROXETIL 200 MG PO TABS: 100.0000 mg | ORAL_TABLET | Freq: Two times a day (BID) | ORAL | Status: DC

## 2015-11-27 MED ORDER — WITCH HAZEL-GLYCERIN EX PADS: 1.0000 "application " | MEDICATED_PAD | CUTANEOUS | Status: DC | PRN

## 2015-11-27 MED ORDER — SENNOSIDES-DOCUSATE SODIUM 8.6-50 MG PO TABS
2.0000 | ORAL_TABLET | ORAL | Status: DC
  Administered 2015-11-27 – 2015-11-28 (×2): 2 via ORAL
  Filled 2015-11-27 (×2): qty 2

## 2015-11-27 MED ORDER — IBUPROFEN 600 MG PO TABS
600.0000 mg | ORAL_TABLET | Freq: Four times a day (QID) | ORAL | Status: DC
  Administered 2015-11-27 – 2015-11-29 (×10): 600 mg via ORAL
  Filled 2015-11-27 (×10): qty 1

## 2015-11-27 MED ORDER — TETANUS-DIPHTH-ACELL PERTUSSIS 5-2.5-18.5 LF-MCG/0.5 IM SUSP
0.5000 mL | Freq: Once | INTRAMUSCULAR | Status: DC
  Filled 2015-11-27: qty 0.5

## 2015-11-27 MED ORDER — OXYCODONE-ACETAMINOPHEN 5-325 MG PO TABS
1.0000 | ORAL_TABLET | ORAL | Status: DC | PRN
  Administered 2015-11-27 – 2015-11-28 (×2): 1 via ORAL
  Filled 2015-11-27 (×2): qty 1

## 2015-11-27 MED ORDER — ZOLPIDEM TARTRATE 5 MG PO TABS: 5.0000 mg | ORAL_TABLET | Freq: Every evening | ORAL | Status: DC | PRN

## 2015-11-27 MED ORDER — ONDANSETRON HCL 4 MG PO TABS: 4.0000 mg | ORAL_TABLET | ORAL | Status: DC | PRN

## 2015-11-27 MED ORDER — BENZOCAINE-MENTHOL 20-0.5 % EX AERO
1.0000 "application " | INHALATION_SPRAY | CUTANEOUS | Status: DC | PRN
  Administered 2015-11-27: 1 via TOPICAL
  Filled 2015-11-27 (×2): qty 56

## 2015-11-27 MED ORDER — DIBUCAINE 1 % RE OINT
1.0000 "application " | TOPICAL_OINTMENT | RECTAL | Status: DC | PRN
  Filled 2015-11-27: qty 28

## 2015-11-27 MED ORDER — PRENATAL MULTIVITAMIN CH
1.0000 | ORAL_TABLET | Freq: Every day | ORAL | Status: DC
  Administered 2015-11-27 – 2015-11-29 (×3): 1 via ORAL
  Filled 2015-11-27 (×3): qty 1

## 2015-11-27 MED ORDER — SIMETHICONE 80 MG PO CHEW: 80.0000 mg | CHEWABLE_TABLET | ORAL | Status: DC | PRN

## 2015-11-27 MED ORDER — LANOLIN HYDROUS EX OINT: TOPICAL_OINTMENT | CUTANEOUS | Status: DC | PRN

## 2015-11-27 NOTE — Lactation Note (Signed)
This note was copied from the chart of Lisa Erickson. Lactation Consultation Note Initial visit at 14 hours of age.  Mom reports good feedings but reports nipple pain.  Nipples are semi flat with short shaft, breast tissue is compressible.  Undressed baby and noted cry/scream when holding right hand to undress.  LC will inform MBU RN to assess baby further.  INstructed mom on hand expression with a few drops noted.  Assisted with football hold on left breast.  Baby latched well with curled up lower lip, LC tugged on lower lip to roll out and mom reports increased comfort.  Baby maintained feeding for about 5 minutes with stimulation and then was sleepy and remains STS with mom.  LC unable to express colostrum after feeding.  [facility] given and discussed.  Encouraged to feed with early cues on demand.  Early newborn behavior discussed.  Mom to call for assist as needed.   Patient Name: Lisa Pershing ProudBrittany Jobe Today's Date: 11/27/2015 Reason for consult: Initial assessment   Maternal Data Has patient been taught Hand Expression?: Yes Does the patient have breastfeeding experience prior to this delivery?: No  Feeding Feeding Type: Breast Fed Length of feed: 5 min  LATCH Score/Interventions Latch: Repeated attempts needed to sustain latch, nipple held in mouth throughout feeding, stimulation needed to elicit sucking reflex. Intervention(s): Adjust position;Assist with latch;Breast massage;Breast compression  Audible Swallowing: A few with stimulation Intervention(s): Skin to skin;Hand expression;Alternate breast massage  Type of Nipple: Everted at rest and after stimulation (semi flat short shaft)  Comfort (Breast/Nipple): Soft / non-tender     Hold (Positioning): Assistance needed to correctly position infant at breast and maintain latch. Intervention(s): Position options;Support Pillows;Breastfeeding basics reviewed;Skin to skin  LATCH Score: 7  Lactation Tools  Discussed/Used     Consult Status Consult Status: Follow-up Date: 11/28/15 Follow-up type: In-patient    Jannifer RodneyShoptaw, Jana Lynn 11/27/2015, 6:51 PM

## 2015-11-27 NOTE — Anesthesia Postprocedure Evaluation (Signed)
Anesthesia Post Note  Patient: Lisa Erickson  Procedure(s) Performed: * No procedures listed *  Patient location during evaluation: L&D Anesthesia Type: Epidural Level of consciousness: awake and alert and oriented Pain management: pain level controlled Vital Signs Assessment: post-procedure vital signs reviewed and stable Respiratory status: spontaneous breathing and respiratory function stable Cardiovascular status: blood pressure returned to baseline Postop Assessment: no headache, no backache, patient able to bend at knees, no signs of nausea or vomiting and adequate PO intake Anesthetic complications: no    Last Vitals:  Filed Vitals:   11/27/15 0611 11/27/15 1118  BP: 121/73 147/93  Pulse: 113 98  Temp: 37.3 C   Resp:  20    Last Pain:  Filed Vitals:   11/27/15 1214  PainSc: 0-No pain                 Cadence Haslam

## 2015-11-28 MED ORDER — LABETALOL HCL 5 MG/ML IV SOLN: 20.0000 mg | INTRAVENOUS | Status: DC | PRN

## 2015-11-28 MED ORDER — ENALAPRIL MALEATE 10 MG PO TABS
10.0000 mg | ORAL_TABLET | Freq: Every day | ORAL | Status: DC
  Administered 2015-11-28 – 2015-11-29 (×2): 10 mg via ORAL
  Filled 2015-11-28 (×3): qty 1

## 2015-11-28 MED ORDER — LABETALOL HCL 200 MG PO TABS
200.0000 mg | ORAL_TABLET | Freq: Once | ORAL | Status: AC
  Administered 2015-11-28: 200 mg via ORAL
  Filled 2015-11-28: qty 1

## 2015-11-28 NOTE — Progress Notes (Addendum)
Post Partum Day 1 Subjective:  Lisa Erickson is a 19 y.o. G1P1001 2980w0d s/p NSVD after IOL for pre-eclampsia.  No acute events overnight.  Pt denies problems with ambulating, voiding or po intake.  She denies nausea or vomiting.  Pain is well controlled.  She has had flatus.  Lochia Small.  Plan for birth control is Nexplanon.  Method of Feeding: Breast. BPs have been intermittently elevated into the 140s/90s. No severe symptoms.   Objective: Blood pressure 150/83, pulse 86, temperature 98 F (36.7 C), temperature source Oral, resp. rate 20, height 5\' 2"  (1.575 m), weight 129.275 kg (285 lb), last menstrual period 01/31/2015, SpO2 99 %, unknown if currently breastfeeding.  Physical Exam:  General: alert, cooperative and no distress Lochia:normal flow Chest: normal WOB Heart: Regular rate Abdomen: +BS, soft, mild TTP (appropriate) Uterine Fundus: firm, below umbillicus DVT Evaluation: No evidence of DVT seen on physical exam. Extremities: No edema   Recent Labs  11/26/15 1912 11/27/15 0740  HGB 10.8* 10.7*  HCT 32.1* 32.1*    Assessment/Plan:  ASSESSMENT: Lisa Erickson is a 19 y.o. G1P1001 6180w0d s/p NSVD.   Plan for discharge tomorrow  Monitor BP Continue routine PP care Breastfeeding support PRN  LOS: 2 days   [name] 11/28/2015, 7:29 AM    CNM attestation Post Partum Day #1 I have seen and examined this patient and agree with above documentation in the resident's note.   Lisa Erickson is a 19 y.o. G1P1001 s/p SVD.  Pt denies problems with ambulating, voiding or po intake. Pain is well controlled.  Plan for birth control is Nexplanon.  Method of Feeding: breast  PE:  BP 150/83 mmHg  Pulse 86  Temp(Src) 98 F (36.7 C) (Oral)  Resp 20  Ht 5\' 2"  (1.575 m)  Wt 129.275 kg (285 lb)  BMI 52.11 kg/m2  SpO2 99%  LMP 01/31/2015  Breastfeeding? Unknown Fundus firm  Plan for discharge: 11/29/15  Cam HaiSHAW, KIMBERLY, CNM 8:48 AM  11/28/2015

## 2015-11-28 NOTE — Progress Notes (Signed)
Filed Vitals:   11/28/15 0624 11/28/15 1725 11/28/15 1810 11/28/15 1950  BP: 150/83 160/89 151/90 155/92  Pulse: 86 94 87 90  Temp: 98 F (36.7 C)     TempSrc: Oral Oral    Resp: 20 20    Height:      Weight:      SpO2:  98%     Will give one dose of PO labetalol (IV out) and start Vasotec 10mg  daily.  If BP reaches severe range (>/= 160/110), will start MgSO4.  Discussed with Dr. Shawnie PonsPratt, who agrees with plan.

## 2015-11-28 NOTE — Lactation Note (Signed)
This note was copied from the chart of Lisa Erickson. Lactation Consultation Note  Patient Name: Lisa Erickson ZOXWR'UToday's Date: 11/28/2015 Reason for consult: Follow-up assessment   Baby 33 hours old. Baby crying and cueing to nurse when this LC entered room. Mom states that she thinks baby had a "bad dream" and is fighting sleep. Discussed feeding cues and offered to assist with latch. Baby latched easily, suckling rhythmically with swallows noted. Mom states that her nipples have been sore. Flanged baby's lower lip and mom reported increased comfort. Enc use of EBM. Mom's breast easily expressible with colostrum present. Enc mom to nurse with cues. Maternal Data    Feeding Feeding Type: Breast Milk Length of feed: 15 min  LATCH Score/Interventions Latch: Grasps breast easily, tongue down, lips flanged, rhythmical sucking.  Audible Swallowing: A few with stimulation Intervention(s): Skin to skin;Hand expression  Type of Nipple: Everted at rest and after stimulation  Comfort (Breast/Nipple): Filling, red/small blisters or bruises, mild/mod discomfort  Problem noted: Mild/Moderate discomfort  Hold (Positioning): No assistance needed to correctly position infant at breast.  LATCH Score: 8  Lactation Tools Discussed/Used     Consult Status Consult Status: Follow-up Date: 11/29/15 Follow-up type: In-patient    Geralynn OchsWILLIARD, Oriah Leinweber 11/28/2015, 2:18 PM

## 2015-11-28 NOTE — Progress Notes (Signed)
RN CALLED  Flo ShanksB. SMITH, CNMW AND NOTIFIED  HER OF PT'S B/P'S  TRENDING UPWARD. LATEST 155/92- HEART RATE 90, CNMW ALSO INFORMED OF SOCIAL ISSUES WITH BIOLOGICAL MOTHER AND INFANT FEEDING ISSUES. SS WORKER CONSULT PLACED. AWAITING ORDERS FROM CNMW

## 2015-11-28 NOTE — Progress Notes (Signed)
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 Also patient very agitated with mention of her biological mother and visibly annoyed with infant cluster feeding. Patient has been appropriate in caring for the infant but shows signs of frustration with frequency and length of feedings.

## 2015-11-29 MED ORDER — IBUPROFEN 600 MG PO TABS: 600.0000 mg | ORAL_TABLET | Freq: Four times a day (QID) | ORAL | Status: DC

## 2015-11-29 MED ORDER — ENALAPRIL MALEATE 10 MG PO TABS: 10.0000 mg | ORAL_TABLET | Freq: Every day | ORAL | Status: AC

## 2015-11-29 MED ORDER — HYDROCHLOROTHIAZIDE 12.5 MG PO CAPS
12.5000 mg | ORAL_CAPSULE | Freq: Every day | ORAL | Status: DC
  Administered 2015-11-29: 12.5 mg via ORAL
  Filled 2015-11-29 (×2): qty 1

## 2015-11-29 MED ORDER — HYDROCHLOROTHIAZIDE 12.5 MG PO CAPS: 12.5000 mg | ORAL_CAPSULE | Freq: Every day | ORAL | Status: AC

## 2015-11-29 NOTE — Progress Notes (Signed)
Dr. Cathlean CowerMikell was notified of repeat BP of 150/86. Ordered to proceed with discharge as patient is very intent on going home today. Patient given discharge instructions for her and baby along with preeclampsia signs and symptoms to report immediately to MD. Patient verbalized understanding of all instructions.

## 2015-11-29 NOTE — Discharge Instructions (Signed)

## 2015-11-29 NOTE — Lactation Note (Signed)
This note was copied from the chart of Lisa Erickson. Lactation Consultation Note  Patient Name: Lisa Pershing ProudBrittany Erickson MVHQI'OToday's Date: 11/29/2015 Reason for consult: Follow-up assessment Baby suckling on pacifier when LC arrived. Had recently BF on right breast. LC discussed risk of pacifier use early and missing feeding ques. Baby at 6.8% weight loss with a loss of 4.2% in the past 24 hours. Baby has been to the breast 12 times on average greater than 15 minutes but during the night baby had few < 10 minute feedings. Baby has had 2 voids in 24 hours and 1 stool. 3 voids in life/8 stools in life. LC encouraged Mom to be breastfeeding both breasts each feeding as much as possible to bring milk in well and help baby obtain more calories. Advised if baby suckling on pacifier, should be at the breast. Advised baby should be at the breast 8-12 times in 24 hours and with feeding ques. At this visit, assisted Mom with positioning to help baby obtain good depth. Baby demonstrated some good suckling bursts with noted swallows with stimulation. Mom denies discomfort. Engorgement care reviewed if needed. Demonstrated hand pump to use if needed. Advised to monitor void/stool. Advised once home, if baby not having adequate output or not waking to BF then Mom needs to contact Peds after d/c home. Mom reports Peds appointment on Tuesday due to holiday weekend. Encouraged to call for questions/concerns.   Maternal Data    Feeding Feeding Type: Breast Fed Length of feed: 20 min  LATCH Score/Interventions Latch: Repeated attempts needed to sustain latch, nipple held in mouth throughout feeding, stimulation needed to elicit sucking reflex. Intervention(s): Adjust position;Assist with latch;Breast massage;Breast compression  Audible Swallowing: Spontaneous and intermittent (with breast massage/stimulation)  Type of Nipple: Everted at rest and after stimulation  Comfort (Breast/Nipple): Soft / non-tender     Hold (Positioning): Assistance needed to correctly position infant at breast and maintain latch. Intervention(s): Breastfeeding basics reviewed;Support Pillows;Position options;Skin to skin  LATCH Score: 8  Lactation Tools Discussed/Used Tools: Pump Breast pump type: Manual   Consult Status Consult Status: Complete Date: 11/29/15 Follow-up type: In-patient    Alfred LevinsGranger, [name] 11/29/2015, 10:23 AM

## 2015-11-29 NOTE — Clinical Social Work Maternal (Signed)
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    Referral Source:  RN   Address:  [address] [place], [place] [address]  Phone number:  [phone]   Household Members:  Significant Other   Natural Supports (not living in the home):  Immediate Family   Professional Supports: None   Employment: Unemployed   Type of Work:   N/A  Education:    N/A  Architectinancial Resources:  Medicaid   Other Resources:  Sales executiveood Stamps , [employer]   Cultural/Religious Considerations Which May Impact Care:  None reported  Strengths:  Ability to meet basic needs , Merchandiser, retailediatrician chosen , Home prepared for child    Risk Factors/Current Problems:  None  Cognitive State:  Able to Concentrate , Alert , Linear Thinking , Goal Oriented    Mood/Affect:  Calm , Happy    CSW Assessment:  CSW received request for consult due to concerns about documentation of MOB presenting with diagnosis of Mild Mental Retardation, with notable frustrations secondary to feedings.  FOB was sleeping on the couch during the assessment, but MOB provided CSW to engage in complete assessment in his presence.   MOB presented as difficult to engage as evidence by maintaining minimal eye contact and having short and concise answers.  MOB was not forthcoming with her thoughts and feelings as she transitions postpartum, and did not indicate any feelings of frustration secondary to feedings.  MOB stated that she feels physically and mentally prepared and ready to go home.  MOB reported that she and the FOB took  parenting classes during the pregnancy, and have the home prepared for the infant. Per MOB, she lives alone with the FOB.  MOB stated that her mother lives 3 hours away, and stated that they have a "good" relationship.  MOB stated that the FOB "sometimes works", but shared that she will be at home to care for the infant. Per MOB, [facility] and receives [facility].  MOB denied mental health complications during the pregnancy, and denied prior mental health history.   CSW provided education on perinatal mood and anxiety disorders, and she denied any questions or concerns about the information. MOB agreed to follow up with her medical provider if she notes onset of symptoms.   MOB answered questions appropriately, and was observed to be hand expressing and preparing to feed the infant during the visit. MOB was in a calm mood, and did present as anxious, frustrated, or agitated.  CSW normalized feelings of frustration with a newborn, and reviewed importance of engaging in grounding techniques if she notes feelings of frustration. MOB acknowledged that it is normal to feel overwhelmed, and shared intention to put the infant in the crib and walk away if she is frustrated.  MOB denied questions, concerns, or needs at this time. She expressed appreciation for the visit, and agreed to contact CSW if additional needs arise.   CSW Plan/Description:   1. Patient/Family Education-- Perinatal mood disorders  2. Information/Referral to [employer]Community Resources-- [facility]  3. No Further  Intervention Required/No Barriers to Discharge    Lisa Erickson 11/29/2015, 2:38 PM

## 2015-11-29 NOTE — Progress Notes (Signed)
1 hour vs for 4 hours completed. Plan to start vs Q 6 hours

## 2015-11-29 NOTE — Discharge Summary (Signed)
OB Discharge Summary     Patient Name: Lisa Erickson DOB: [birth year] MRN: [id number]  Date of admission: 11/25/2015 Delivering MD: Olivia Canter   Date of discharge: 11/29/2015  Admitting diagnosis: Decreased fetal movement, third trimester, not applicable or unspecified fetus [O36.8130] Intrauterine pregnancy: [redacted]w[redacted]d     Secondary diagnosis:  Principal Problem:   Normal spontaneous vaginal delivery Active Problems:   Abnormal maternal serum screening test   Preeclampsia  Additional problems: UTI on admission      Discharge diagnosis: Term Pregnancy Delivered                                                                                                Post partum procedures:None  Augmentation: Pitocin, Cytotec and Foley Balloon  Complications: None  Hospital course:  Induction of Labor With Vaginal Delivery   19 y.o. yo G1P1001 at [redacted]w[redacted]d was admitted to the hospital 11/25/2015 for induction of labor.  Indication for induction: Preeclampsia without severe feature. Patient was induced with cytotec x2 and foley bulb, followed by pitocin. Patient labored on pitocin until complete and ready to push Patient had an uncomplicated labor course as follows: Membrane Rupture Time/Date: 8:40 PM ,11/26/2015   Intrapartum Procedures: Episiotomy: None [1]                                         Lacerations:  None [1]  Patient had delivery of a Viable infant.  Information for the patient's newborn:  Idania, Desouza [[id number]]  Delivery Method: Vag-Spont    11/27/2015  Details of delivery can be found in separate delivery note.  Patient was admitted with UTI and received complete treatment with 3 days of antibotics post delivery. Patient's blood pressures remained elevated after delivery. Patient was started on enalapril and HCTZ to control blood pressure. Otherwise, patient had a routine postpartum course. Patient is discharged home 11/29/2015   Physical exam  Filed Vitals:    11/29/15 0615 11/29/15 0916 11/29/15 1127 11/29/15 1500  BP: 144/77 155/91 159/84 165/88  Pulse: 86  92 90  Temp: 98.3 F (36.8 C)     TempSrc: Oral     Resp: Height:      Weight:      SpO2:       General: alert, cooperative and no distress Lochia: appropriate Uterine Fundus: firm Incision: N/A DVT Evaluation: No evidence of DVT seen on physical exam. Labs: Lab Results  Component Value Date   WBC 13.8* 11/27/2015   HGB 10.7* 11/27/2015   HCT 32.1* 11/27/2015   MCV 82.5 11/27/2015   PLT 193 11/27/2015   CMP Latest Ref Rng 11/25/2015  Glucose 65 - 99 mg/dL 93  BUN 6 - 20 mg/dL 6  Creatinine 7.82 - 9.56 mg/dL 2.13(Y)  Sodium 865 - 784 mmol/L 138  Potassium 3.5 - 5.1 mmol/L 3.7  Chloride 101 - 111 mmol/L 105  CO2 22 - 32 mmol/L -  Calcium 8.9 - 10.3 mg/dL -  Total Protein  6.5 - 8.1 g/dL -  Total Bilirubin 0.3 - 1.2 mg/dL -  Alkaline Phos 38 - 098126 U/L -  AST 15 - 41 U/L -  ALT 14 - 54 U/L -    Discharge instruction: per After Visit Summary and "Baby and Me Booklet".  After visit meds:    Medication List    STOP taking these medications        acetaminophen 325 MG tablet  Commonly known as:  TYLENOL      TAKE these medications        enalapril 10 MG tablet  Commonly known as:  VASOTEC  Take 1 tablet (10 mg total) by mouth daily.     hydrochlorothiazide 12.5 MG capsule  Commonly known as:  MICROZIDE  Take 1 capsule (12.5 mg total) by mouth daily.     ibuprofen 600 MG tablet  Commonly known as:  ADVIL,MOTRIN  Take 1 tablet (600 mg total) by mouth every 6 (six) hours.      ASK your doctor about these medications        PRENATAL VITAMIN PO  Take by mouth daily.        Diet: routine diet  Activity: Advance as tolerated. Pelvic rest for 6 weeks.   Outpatient follow up:6 weeks, baby love to check blood pressure this coming week.  Follow up Appt:No future appointments. Follow up Visit:No Follow-up on file.  Postpartum contraception:  Nexplanon  Newborn Data: Live born female  Birth Weight: 8 lb 7.5 oz (3840 g) APGAR: 8, 9  Baby Feeding: Breast Disposition:home with mother   11/29/2015 Asiyah Angelene GiovanniZ Mikell, MD   OB FELLOW DISCHARGE ATTESTATION  I have seen and examined this patient and agree with above documentation in the resident's note. For preeclampsia no severe features, no mg. BP persistently elevated, started on vasotec and hctz. Nurse visit for BP check early next week  Shonna ChockNoah Ezmeralda Stefanick, OB Fellow

## 2015-11-29 NOTE — Progress Notes (Signed)
RN agreeing with [facility]. Especially about infant feeding and mother's respose to feedings.

## 2016-01-24 ENCOUNTER — Encounter (HOSPITAL_COMMUNITY): Payer: Self-pay | Admitting: Family Medicine

## 2016-01-24 ENCOUNTER — Emergency Department (HOSPITAL_COMMUNITY)
Admission: EM | Admit: 2016-01-24 | Discharge: 2016-01-24 | Disposition: A | Discharge status: Home / Self Care | Payer: Medicaid Other | Attending: Emergency Medicine | Admitting: Emergency Medicine

## 2016-01-24 DIAGNOSIS — Z791 Long term (current) use of non-steroidal anti-inflammatories (NSAID): Secondary | ICD-10-CM | POA: Insufficient documentation

## 2016-01-24 DIAGNOSIS — Z79899 Other long term (current) drug therapy: Secondary | ICD-10-CM | POA: Insufficient documentation

## 2016-01-24 DIAGNOSIS — R000 Tachycardia, unspecified: Secondary | ICD-10-CM | POA: Insufficient documentation

## 2016-01-24 DIAGNOSIS — R319 Hematuria, unspecified: Secondary | ICD-10-CM

## 2016-01-24 DIAGNOSIS — Z87891 Personal history of nicotine dependence: Secondary | ICD-10-CM | POA: Insufficient documentation

## 2016-01-24 DIAGNOSIS — N939 Abnormal uterine and vaginal bleeding, unspecified: Secondary | ICD-10-CM | POA: Insufficient documentation

## 2016-01-24 DIAGNOSIS — N390 Urinary tract infection, site not specified: Secondary | ICD-10-CM | POA: Insufficient documentation

## 2016-01-24 LAB — CBC
HCT: 35.5 % — ABNORMAL LOW (ref 36.0–46.0)
Hemoglobin: 12.4 g/dL (ref 12.0–15.0)
MCH: 28.4 pg (ref 26.0–34.0)
MCHC: 34.9 g/dL (ref 30.0–36.0)
MCV: 81.2 fL (ref 78.0–100.0)
PLATELETS: 296 10*3/uL (ref 150–400)
RBC: 4.37 MIL/uL (ref 3.87–5.11)
RDW: 14.5 % (ref 11.5–15.5)
WBC: 13.9 10*3/uL — ABNORMAL HIGH (ref 4.0–10.5)

## 2016-01-24 LAB — URINE MICROSCOPIC-ADD ON

## 2016-01-24 LAB — COMPREHENSIVE METABOLIC PANEL
ALK PHOS: 71 U/L (ref 38–126)
ALT: 17 U/L (ref 14–54)
AST: 18 U/L (ref 15–41)
Albumin: 3.5 g/dL (ref 3.5–5.0)
Anion gap: 12 (ref 5–15)
BUN: 7 mg/dL (ref 6–20)
CALCIUM: 8.9 mg/dL (ref 8.9–10.3)
CHLORIDE: 105 mmol/L (ref 101–111)
CO2: 23 mmol/L (ref 22–32)
CREATININE: 0.48 mg/dL (ref 0.44–1.00)
Glucose, Bld: 112 mg/dL — ABNORMAL HIGH (ref 65–99)
Potassium: 3.5 mmol/L (ref 3.5–5.1)
Sodium: 140 mmol/L (ref 135–145)
Total Bilirubin: 0.7 mg/dL (ref 0.3–1.2)
Total Protein: 6.8 g/dL (ref 6.5–8.1)

## 2016-01-24 LAB — I-STAT BETA HCG BLOOD, ED (MC, WL, AP ONLY): I-stat hCG, quantitative: <5 m[IU]/mL (ref <5)

## 2016-01-24 LAB — URINALYSIS, ROUTINE W REFLEX MICROSCOPIC
Bilirubin Urine: NEGATIVE
GLUCOSE, UA: NEGATIVE mg/dL
Ketones, ur: NEGATIVE mg/dL
Nitrite: NEGATIVE
PH: 6.5 (ref 5.0–8.0)
Protein, ur: NEGATIVE mg/dL
Specific Gravity, Urine: 1.017 (ref 1.005–1.030)

## 2016-01-24 LAB — LIPASE, BLOOD: LIPASE: 18 U/L (ref 11–51)

## 2016-01-24 LAB — I-STAT CG4 LACTIC ACID, ED: LACTIC ACID, VENOUS: 1.03 mmol/L (ref 0.5–2.0)

## 2016-01-24 MED ORDER — KETOROLAC TROMETHAMINE 30 MG/ML IJ SOLN
30.0000 mg | Freq: Once | INTRAMUSCULAR | Status: AC
  Administered 2016-01-24: 30 mg via INTRAVENOUS
  Filled 2016-01-24: qty 1

## 2016-01-24 MED ORDER — DEXTROSE 5 % IV SOLN
1.0000 g | Freq: Once | INTRAVENOUS | Status: AC
  Administered 2016-01-24: 1 g via INTRAVENOUS
  Filled 2016-01-24: qty 10

## 2016-01-24 MED ORDER — CEPHALEXIN 500 MG PO CAPS: 500.0000 mg | ORAL_CAPSULE | Freq: Three times a day (TID) | ORAL | Status: AC

## 2016-01-24 MED ORDER — SODIUM CHLORIDE 0.9 % IV BOLUS (SEPSIS)
1000.0000 mL | INTRAVENOUS | Status: AC
  Administered 2016-01-24 (×2): 1000 mL via INTRAVENOUS

## 2016-01-24 MED ORDER — IBUPROFEN 600 MG PO TABS: 600.0000 mg | ORAL_TABLET | Freq: Four times a day (QID) | ORAL | Status: AC | PRN

## 2016-01-24 NOTE — ED Provider Notes (Signed)
CSN: [id number]     Arrival date & time 01/24/16  4098 History   First MD Initiated Contact with Patient 01/24/16 1227     Chief Complaint  Patient presents with  . Abdominal Pain     (Consider location/radiation/quality/duration/timing/severity/associated sxs/prior Treatment) Patient is a 20 y.o. female presenting with abdominal pain. The history is provided by the patient.  Abdominal Pain Pain location:  Suprapubic Pain quality: aching and cramping   Pain radiates to:  Does not radiate Pain severity:  Mild Onset quality:  Gradual Duration:  2 days Timing:  Constant Progression:  Unchanged Chronicity:  New Context comment:  Recent childbirth Relieved by:  Nothing Worsened by:  Nothing tried Ineffective treatments:  None tried Associated symptoms: dysuria and vaginal bleeding (started period)   Associated symptoms: no anorexia, no constipation, no diarrhea and no fever     Past Medical History  Diagnosis Date  . Medical history non-contributory    Past Surgical History  Procedure Laterality Date  . No past surgeries     History reviewed. No pertinent family history. Social History  Substance Use Topics  . Smoking status: Former Games developer  . Smokeless tobacco: Never Used  . Alcohol Use: No   OB History    Gravida Para Term Preterm AB TAB SAB Ectopic Multiple Living   0 1     Review of Systems  Constitutional: Negative for fever.  Gastrointestinal: Positive for abdominal pain. Negative for diarrhea, constipation and anorexia.  Genitourinary: Positive for dysuria and vaginal bleeding (started period).  All other systems reviewed and are negative.     Allergies  Review of patient's allergies indicates no known allergies.  Home Medications   Prior to Admission medications   Medication Sig Start Date End Date Taking? Authorizing Provider  enalapril (VASOTEC) 10 MG tablet Take 1 tablet (10 mg total) by mouth daily. 11/29/15  Yes Asiyah Mayra Reel,  MD  hydrochlorothiazide (MICROZIDE) 12.5 MG capsule Take 1 capsule (12.5 mg total) by mouth daily. 11/29/15  Yes Asiyah Mayra Reel, MD  ibuprofen (ADVIL,MOTRIN) 600 MG tablet Take 1 tablet (600 mg total) by mouth every 6 (six) hours. 11/29/15  Yes Asiyah Mayra Reel, MD  Prenatal Vit-Fe Fumarate-FA (PRENATAL VITAMIN PO) Take by mouth daily.    Yes Historical Provider, MD   BP 119/73 mmHg  Pulse 91  Temp(Src) 98.6 F (37 C) (Oral)  Resp 34  SpO2 99% Physical Exam  Constitutional: She is oriented to person, place, and time. She appears well-developed and well-nourished. No distress.  HENT:  Head: Normocephalic.  Eyes: Conjunctivae are normal.  Neck: Neck supple. No tracheal deviation present.  Cardiovascular: Regular rhythm and normal heart sounds.  Tachycardia present.   Pulmonary/Chest: Effort normal and breath sounds normal. No respiratory distress.  Abdominal: Soft. She exhibits no distension. There is tenderness (mild suprapubic).  Neurological: She is alert and oriented to person, place, and time.  Skin: Skin is warm and dry.  Psychiatric: She has a normal mood and affect.    ED Course  Procedures (including critical care time) Labs Review Labs Reviewed  COMPREHENSIVE METABOLIC PANEL - Abnormal; Notable for the following:    Glucose, Bld 112 (*)    All other components within normal limits  CBC - Abnormal; Notable for the following:    WBC 13.9 (*)    HCT 35.5 (*)    All other components within normal limits  URINALYSIS, ROUTINE W REFLEX MICROSCOPIC (NOT AT  [facility]) - Abnormal; Notable for the following:    APPearance CLOUDY (*)    Hgb urine dipstick LARGE (*)    Leukocytes, UA LARGE (*)    All other components within normal limits  URINE MICROSCOPIC-ADD ON - Abnormal; Notable for the following:    Squamous Epithelial / LPF 6-30 (*)    Bacteria, UA MANY (*)    All other components within normal limits  CULTURE, BLOOD (ROUTINE X 2)  CULTURE, BLOOD (ROUTINE X 2)   URINE CULTURE  LIPASE, BLOOD  I-STAT BETA HCG BLOOD, ED ([facility], [facility], [facility] ONLY)  I-STAT CG4 LACTIC ACID, ED    Imaging Review No results found. I have personally reviewed and evaluated these images and lab results as part of my medical decision-making.   EKG Interpretation None      MDM   Final diagnoses:  Urinary tract infection with hematuria, site unspecified    20 y.o. female presents with frequency, pain with urination and lower abdominal pain over the last 2 days. Labs and urine concerning for acute urinary tract infection, Pt is tachycardic initially. Provided multiple IVF boluses and IV rocephin to start treatment for this. Tachycardia improves, Pt tolerating po, remains afebrile, blood cultures drawn for evaluation of possible bacteremia but do not suspect currently. Well appearing on discharge and plan to complete course of keflex for treatment empirically pending culture. Plan to follow up with PCP as needed and return precautions discussed for worsening or new concerning symptoms.     Lyndal Pulley, MD 01/24/16 [phone]

## 2016-01-24 NOTE — Discharge Instructions (Signed)

## 2016-01-24 NOTE — ED Notes (Signed)
Pt here for lower abd pain that started about 2 days ago. sts dizzy. Pt tachy at triage. sts some diarrhea. Pt had a baby in December. Currently on her menstrual.

## 2016-01-26 LAB — URINE CULTURE

## 2016-01-29 LAB — CULTURE, BLOOD (ROUTINE X 2)
CULTURE: NO GROWTH
CULTURE: NO GROWTH

## 2016-05-14 ENCOUNTER — Emergency Department (HOSPITAL_COMMUNITY)
Admission: EM | Admit: 2016-05-14 | Discharge: 2016-05-14 | Disposition: A | Discharge status: Home / Self Care | Payer: Medicaid Other | Attending: Emergency Medicine | Admitting: Emergency Medicine

## 2016-05-14 ENCOUNTER — Encounter (HOSPITAL_COMMUNITY): Payer: Self-pay | Admitting: *Deleted

## 2016-05-14 DIAGNOSIS — N72 Inflammatory disease of cervix uteri: Secondary | ICD-10-CM | POA: Insufficient documentation

## 2016-05-14 DIAGNOSIS — Z87891 Personal history of nicotine dependence: Secondary | ICD-10-CM | POA: Insufficient documentation

## 2016-05-14 DIAGNOSIS — N898 Other specified noninflammatory disorders of vagina: Secondary | ICD-10-CM

## 2016-05-14 DIAGNOSIS — R300 Dysuria: Secondary | ICD-10-CM | POA: Diagnosis not present

## 2016-05-14 LAB — WET PREP, GENITAL
Sperm: NONE SEEN
Trich, Wet Prep: NONE SEEN
Yeast Wet Prep HPF POC: NONE SEEN

## 2016-05-14 LAB — URINALYSIS, ROUTINE W REFLEX MICROSCOPIC
Bilirubin Urine: NEGATIVE
Glucose, UA: NEGATIVE mg/dL
Ketones, ur: NEGATIVE mg/dL
Nitrite: NEGATIVE
Protein, ur: NEGATIVE mg/dL
Specific Gravity, Urine: 1.024 (ref 1.005–1.030)
pH: 5.5 (ref 5.0–8.0)

## 2016-05-14 LAB — URINE MICROSCOPIC-ADD ON

## 2016-05-14 LAB — PREGNANCY, URINE: Preg Test, Ur: NEGATIVE

## 2016-05-14 MED ORDER — AZITHROMYCIN 250 MG PO TABS
1000.0000 mg | ORAL_TABLET | Freq: Once | ORAL | Status: AC
  Administered 2016-05-14: 1000 mg via ORAL
  Filled 2016-05-14: qty 4

## 2016-05-14 MED ORDER — CEFTRIAXONE SODIUM 250 MG IJ SOLR
250.0000 mg | Freq: Once | INTRAMUSCULAR | Status: AC
  Administered 2016-05-14: 250 mg via INTRAMUSCULAR
  Filled 2016-05-14: qty 250

## 2016-05-14 MED ORDER — LIDOCAINE HCL (PF) 1 % IJ SOLN
INTRAMUSCULAR | Status: AC
  Administered 2016-05-14: 0.9 mL via INTRAMUSCULAR
  Filled 2016-05-14: qty 5

## 2016-05-14 NOTE — Discharge Instructions (Signed)
Return here as needed.  Follow-up with the clinic provided.  °

## 2016-05-14 NOTE — ED Provider Notes (Signed)
CSN: [id number]     Arrival date & time 05/14/16  0715 History   First MD Initiated Contact with Patient 05/14/16 [id number]     Chief Complaint  Patient presents with  . Vaginal Discharge  . Dysuria     (Consider location/radiation/quality/duration/timing/severity/associated sxs/prior Treatment) HPI Patient presents to the emergency department with vaginal discharge and discomfort in her vaginal area.  The patient states that she has not had any vaginal bleeding.  The patient states that nothing seems make the condition better or worse.  Patient states she has recently had unprotected sexual contact. The patient denies chest pain, shortness of breath, headache,blurred vision, neck pain, fever, cough, weakness, numbness, dizziness, anorexia, edema, abdominal pain, nausea, vomiting, diarrhea, rash, back pain, dysuria, hematemesis, bloody stool, near syncope, or syncope. Past Medical History  Diagnosis Date  . Medical history non-contributory    Past Surgical History  Procedure Laterality Date  . No past surgeries     History reviewed. No pertinent family history. Social History  Substance Use Topics  . Smoking status: Former Games developermoker  . Smokeless tobacco: Never Used  . Alcohol Use: No   OB History    Gravida Para Term Preterm AB TAB SAB Ectopic Multiple Living   1 1 1       0 1     Review of Systems  All other systems negative except as documented in the HPI. All pertinent positives and negatives as reviewed in the HPI.  Allergies  Review of patient's allergies indicates no known allergies.  Home Medications   Prior to Admission medications   Medication Sig Start Date End Date Taking? Authorizing Provider  ibuprofen (ADVIL,MOTRIN) 600 MG tablet Take 1 tablet (600 mg total) by mouth every 6 (six) hours as needed. 01/24/16  Yes Lyndal Pulleyaniel Knott, MD  Prenatal Vit-Fe Fumarate-FA (PRENATAL VITAMIN PO) Take by mouth daily.    Yes Historical Provider, MD  cephALEXin (KEFLEX) 500 MG capsule Take  1 capsule (500 mg total) by mouth 3 (three) times daily. 01/24/16   Lyndal Pulleyaniel Knott, MD  enalapril (VASOTEC) 10 MG tablet Take 1 tablet (10 mg total) by mouth daily. 11/29/15   Asiyah Mayra ReelZahra Mikell, MD  hydrochlorothiazide (MICROZIDE) 12.5 MG capsule Take 1 capsule (12.5 mg total) by mouth daily. 11/29/15   Asiyah Mayra ReelZahra Mikell, MD   BP 107/78 mmHg  Pulse 82  Temp(Src) 98.1 F (36.7 C) (Oral)  Resp 18  SpO2 98%  LMP 05/06/2016  Breastfeeding? Yes Physical Exam  Constitutional: She is oriented to person, place, and time. She appears well-developed and well-nourished. No distress.  HENT:  Head: Normocephalic and atraumatic.  Mouth/Throat: Oropharynx is clear and moist.  Eyes: Pupils are equal, round, and reactive to light.  Neck: Normal range of motion. Neck supple.  Cardiovascular: Normal rate, regular rhythm and normal heart sounds.  Exam reveals no gallop and no friction rub.   No murmur heard. Pulmonary/Chest: Effort normal and breath sounds normal. No respiratory distress. She has no wheezes.  Abdominal: Soft. Bowel sounds are normal. She exhibits no distension. There is no tenderness. There is no rebound and no guarding.  Genitourinary: Cervix exhibits motion tenderness and discharge. Left adnexum displays tenderness. Vaginal discharge found.  Neurological: She is alert and oriented to person, place, and time. She exhibits normal muscle tone. Coordination normal.  Skin: Skin is warm and dry. No rash noted. No erythema.  Psychiatric: She has a normal mood and affect. Her behavior is normal.  Nursing note and vitals reviewed.  ED Course  Procedures (including critical care time) Labs Review Labs Reviewed  WET PREP, GENITAL - Abnormal; Notable for the following:    Clue Cells Wet Prep HPF POC PRESENT (*)    WBC, Wet Prep HPF POC MANY (*)    All other components within normal limits  URINALYSIS, ROUTINE W REFLEX MICROSCOPIC (NOT AT [facility]) - Abnormal; Notable for the following:     APPearance CLOUDY (*)    Hgb urine dipstick SMALL (*)    Leukocytes, UA LARGE (*)    All other components within normal limits  URINE MICROSCOPIC-ADD ON - Abnormal; Notable for the following:    Squamous Epithelial / LPF 6-30 (*)    Bacteria, UA RARE (*)    All other components within normal limits  PREGNANCY, URINE  GC/CHLAMYDIA PROBE AMP () NOT AT [facility]    Imaging Review No results found. I have personally reviewed and evaluated these images and lab results as part of my medical decision-making.   EKG Interpretation None      MDM   Final diagnoses:  None     Patient will be treated for cervicitis.  Told to return here as needed.  Patient agrees the plan and all questions were answered  Charlestine Night, PA-C 05/14/16 1101  Donnetta Hutching, MD 05/15/16 1309

## 2016-05-14 NOTE — ED Notes (Signed)
Pt reports one week ago burning on urination and vaginal discharge started.

## 2016-05-14 NOTE — ED Notes (Signed)
Pt resting quietly in bed brestfeeding child. Denies c/o pain.

## 2016-05-15 LAB — GC/CHLAMYDIA PROBE AMP (~~LOC~~) NOT AT ARMC
Chlamydia: NEGATIVE
Neisseria Gonorrhea: NEGATIVE

## 2016-10-18 IMAGING — US US MFM OB FOLLOW-UP
1 series · 14 of 28 positions shown · non-contrast
Comparison: none

[Series 1: us mfm ob follow-up · 14 of 31 slices shown]
[im 2/31]
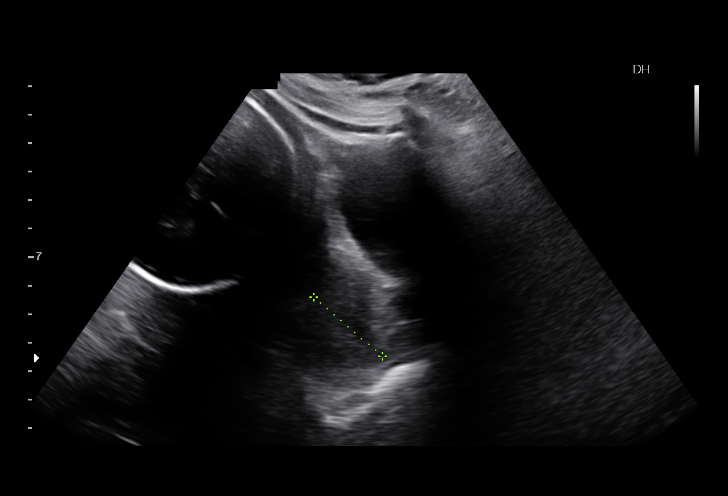
[im 4/31]
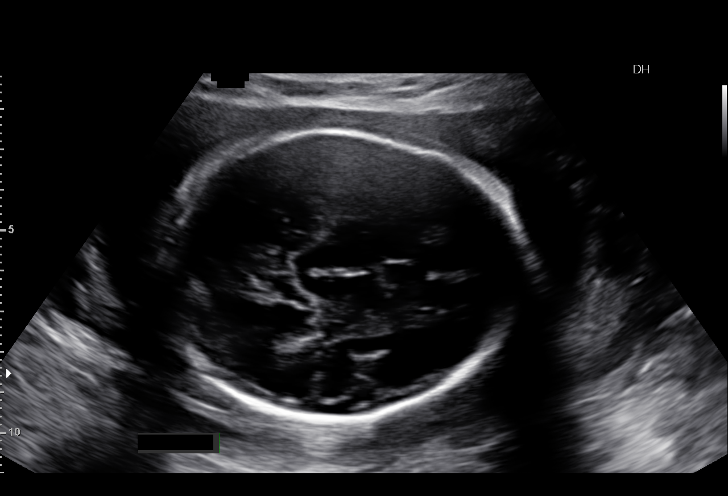
[im 6/31]
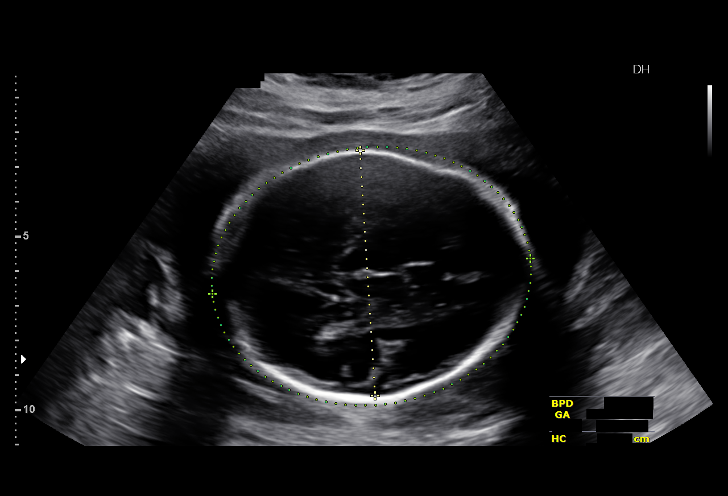
[im 8/31]
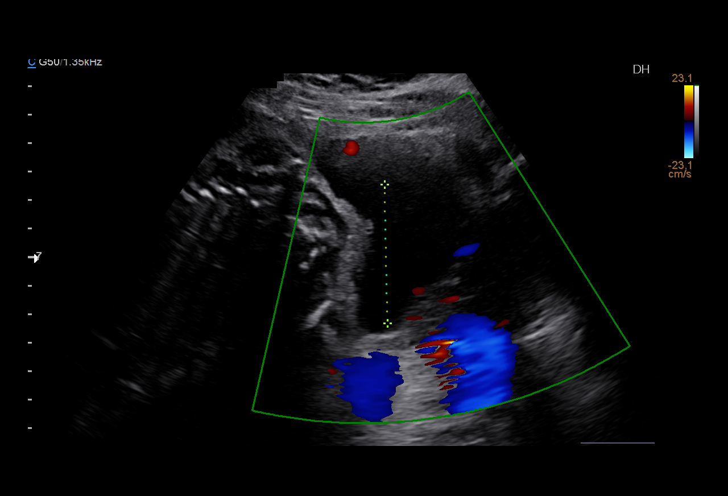
[im 11/31]
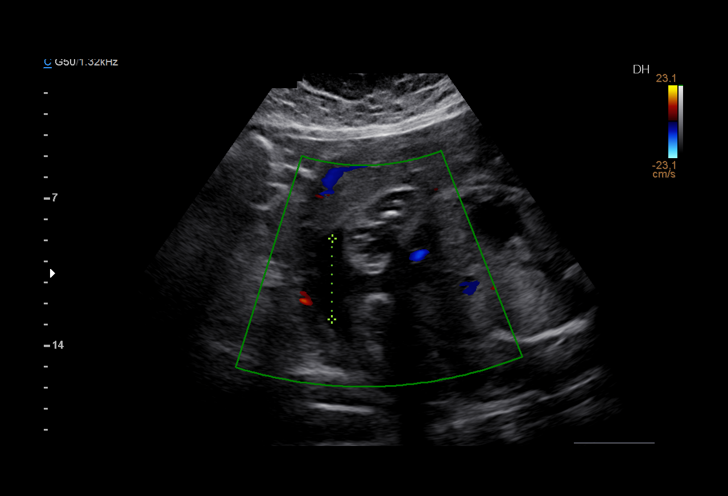
[im 13/31]
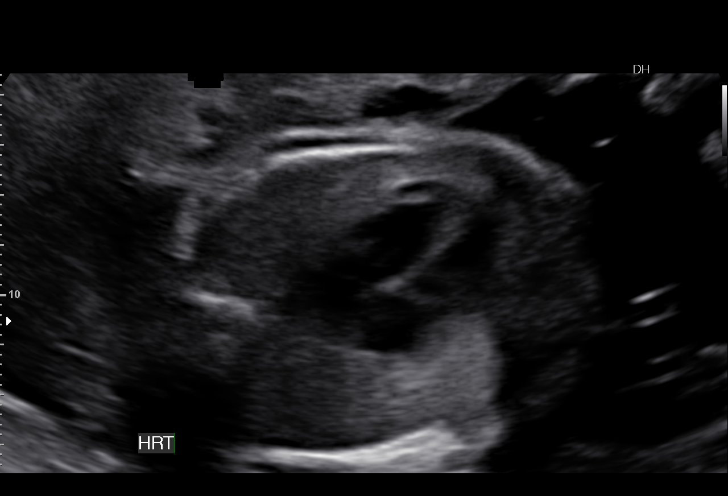
[im 15/31]
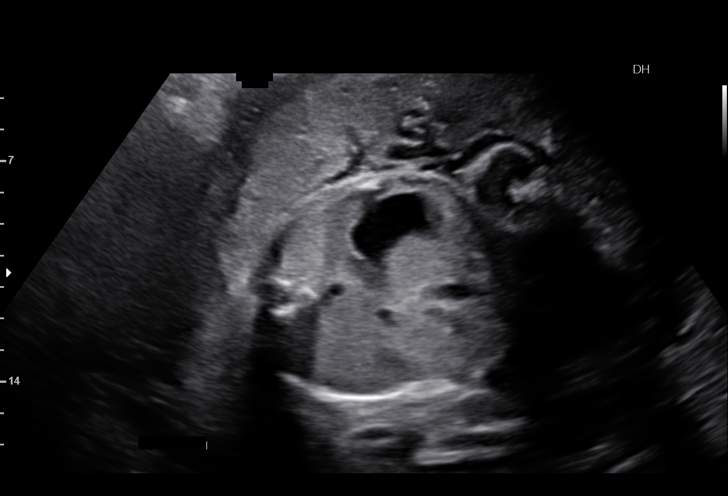
[im 17/31]
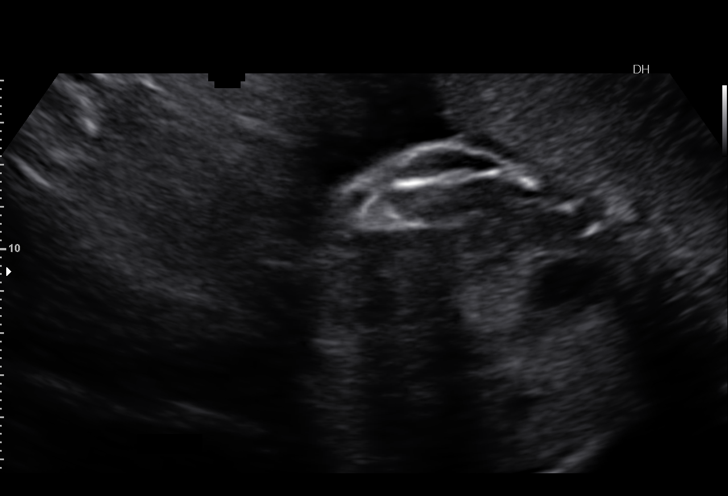
[im 19/31]
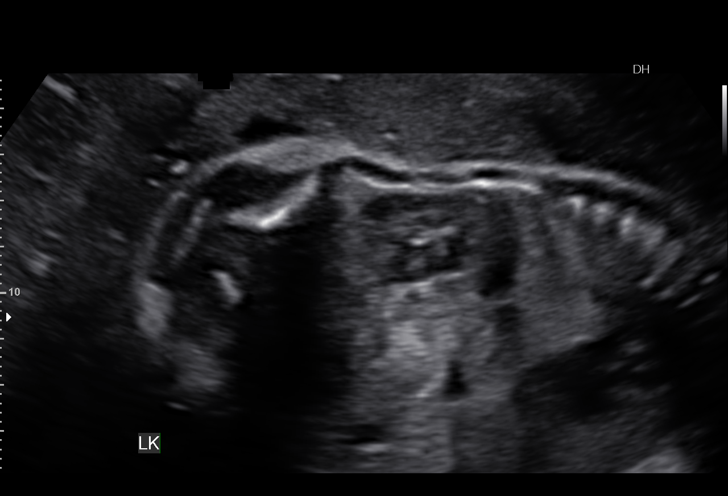
[im 22/31]
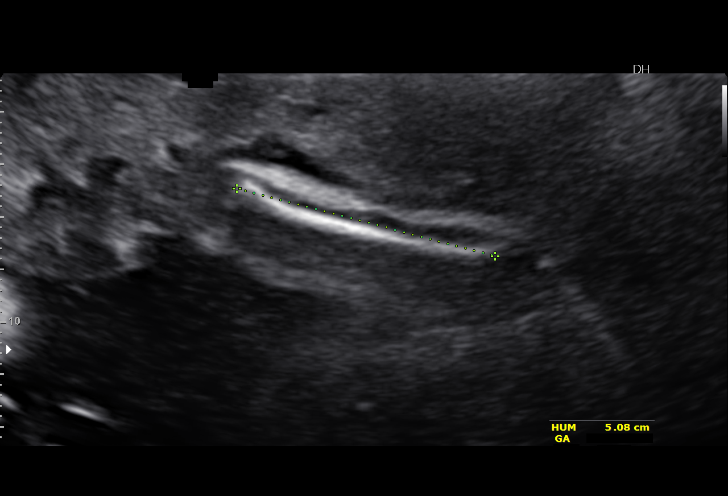
[im 24/31]
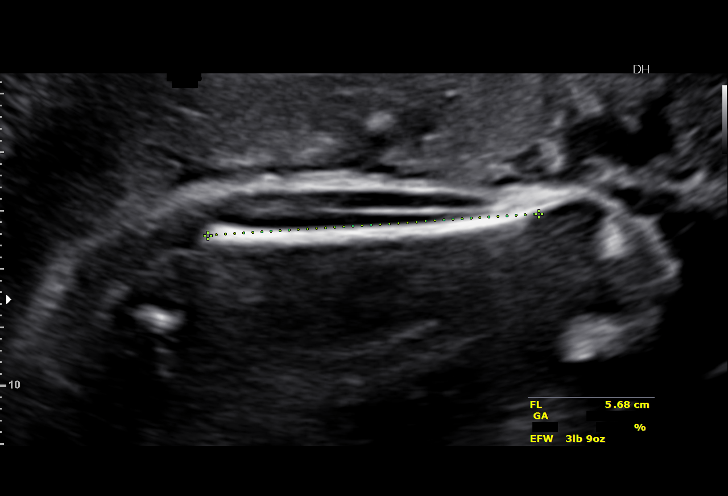
[im 26/31]
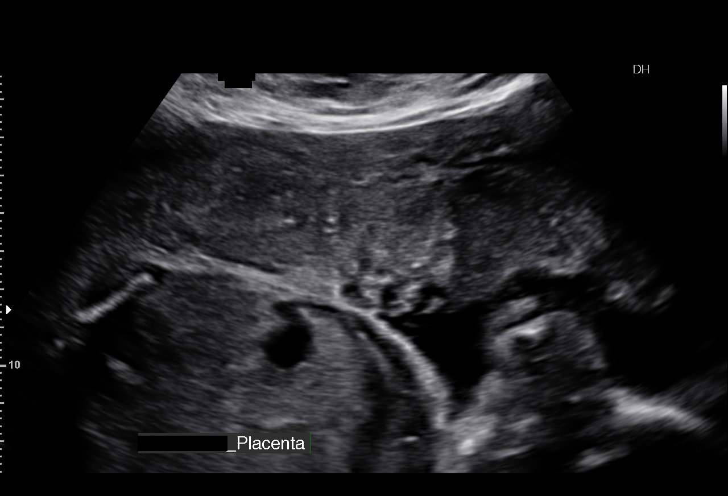
[im 28/31]
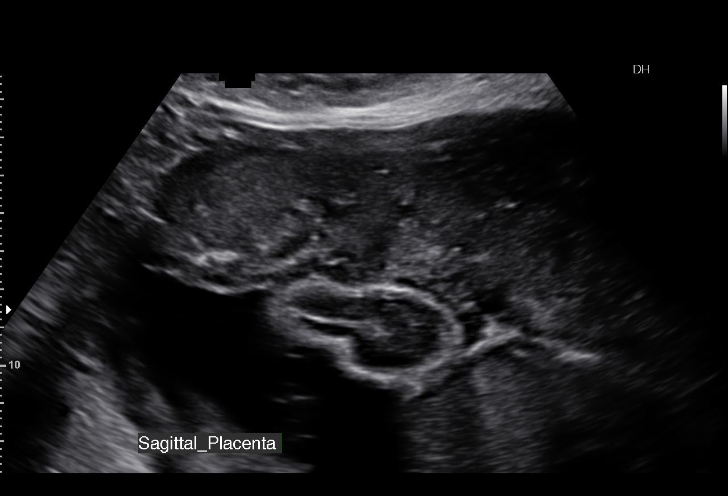
[im 31/31]
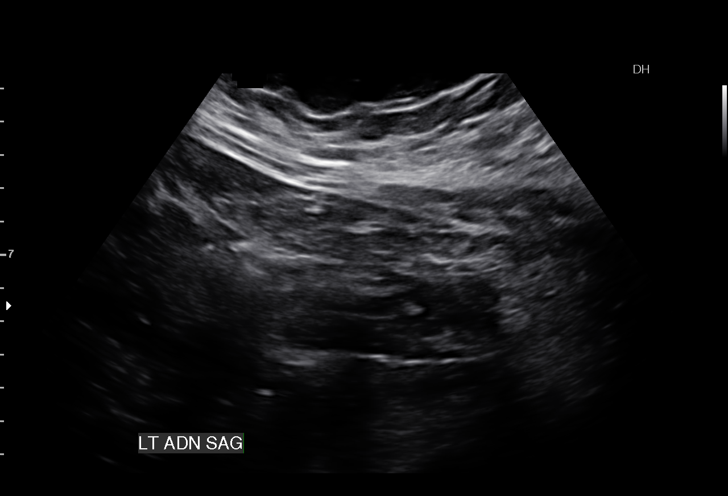

[14 of 28 positions shown; findings below may reference images not displayed]

OBSTETRICS REPORT
(Signed Final 09/20/2015 [DATE])

Name:       LEE ANNE DOEE                      Visit  09/20/2015 [DATE]
Date:

Service(s) Provided

Indications

29 weeks gestation of pregnancy
Abnormal biochemical screen (quad) for Trisomy
21 (DSR [DATE]) - declined further testing
Obesity complicating pregnancy, third trimester
Fetal Evaluation

Num Of             1
Fetuses:
Fetal Heart        144                          bpm
Rate:
Cardiac Activity:  Observed
Presentation:      Cephalic
Placenta:          Anterior, above cervical
os
P. Cord            Previously Visualized
Insertion:

Amniotic Fluid
AFI FV:      Subjectively within normal limits
AFI Sum:     17.18    cm      64  %Tile     Larg Pckt:    5.49   cm
RUQ:   4.86    cm    RLQ:   2.99    cm   LUQ:    5.49    cm   LLQ:    3.84   cm
Biometry

BPD:     71.1   m    G. Age:   28w 4d                 CI:        73.16   70 - 86
m
FL/HC:      21.3   19.6 -
20.8
HC:     264.2   m    G. Age:   28w 5d         9  %    HC/AC:      0.96   0.99 -
m
AC:       276   m    G. Age:   31w 5d        95  %    FL/BPD      79.0   71 - 87
m                                     :
FL:      56.2   m    G. Age:   29w 4d        43  %    FL/AC:      20.4   20 - 24
m
HUM:     48.3   m    G. Age:   28w 2d        28  %
m

Est.        4111   gm    3 lb 8 oz      74   %
FW:
Gestational Age

LMP:           33w 1d        Date:  01/31/15                  EDD:   11/07/15
U/S Today:     29w 4d                                         EDD:   12/02/15
Best:          29w 2d    Det. By:   Early Ultrasound          EDD:   12/04/15
(06/04/15)
Anatomy

Cranium:          Appears normal         Aortic Arch:       Previously seen
Fetal Cavum:      Appears normal         Ductal Arch:       Previously seen
Ventricles:       Appears normal         Diaphragm:         Previously seen
Choroid Plexus:   Previously seen        Stomach:           Appears normal,
left sided
Cerebellum:       Previously seen        Abdomen:           Appears normal
Posterior         Previously seen        Abdominal          Previously seen
Fossa:                                   Wall:
Nuchal Fold:      Not applicable (>20    Cord Vessels:      Previously seen
wks GA)
Face:             Orbits and profile     Kidneys:           Appear normal
previously seen
Lips:             Previously seen        Bladder:           Appears normal
Heart:            Appears normal         Spine:             Previously seen
(4CH, axis, and
situs)
RVOT:             Previously seen        Lower              Previously seen
Extremities:
LVOT:             Previously seen        Upper              Previously seen
Extremities:

Other:   Female gender previously seen. Nasal bone previously visualized.
Technically difficult due to maternal habitus and fetal position.
Cervix Uterus Adnexa

Cervical Length:    3.1       cm

Cervix:       Normal appearance by transabdominal scan.

Adnexa:     No abnormality visualized.
Impression

Single IUP at 29w 2d
Increased DSR by quad screen - declined further testing,
obesity
Normal interval anatomy
Fetal growth is appropriate (74th %tile)
Normal amniotic fluid volume

---------------------------------------------------------------------- Recommendations
Ultrasound for growth in 6 weeks due to BMI.
